# Patient Record
Sex: Female | Born: 1985 | Race: White | Hispanic: No | Marital: Married | Smoking: Current every day smoker
Health system: Southern US, Community
[De-identification: ages and names within clinical notes are randomized; demographics above are authoritative.]

## PROBLEM LIST (undated history)

## (undated) ENCOUNTER — Inpatient Hospital Stay (HOSPITAL_COMMUNITY): Payer: Self-pay

## (undated) DIAGNOSIS — Z8742 Personal history of other diseases of the female genital tract: Secondary | ICD-10-CM

## (undated) DIAGNOSIS — M419 Scoliosis, unspecified: Secondary | ICD-10-CM

## (undated) DIAGNOSIS — E119 Type 2 diabetes mellitus without complications: Secondary | ICD-10-CM

## (undated) DIAGNOSIS — F99 Mental disorder, not otherwise specified: Secondary | ICD-10-CM

## (undated) DIAGNOSIS — T7840XA Allergy, unspecified, initial encounter: Secondary | ICD-10-CM

## (undated) DIAGNOSIS — K219 Gastro-esophageal reflux disease without esophagitis: Secondary | ICD-10-CM

## (undated) HISTORY — PX: CHOLECYSTECTOMY: SHX55

## (undated) HISTORY — DX: Personal history of other diseases of the female genital tract: Z87.42

## (undated) HISTORY — DX: Allergy, unspecified, initial encounter: T78.40XA

## (undated) HISTORY — PX: WISDOM TOOTH EXTRACTION: SHX21

---
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
## 2009-03-17 ENCOUNTER — Emergency Department: Payer: Self-pay | Admitting: Emergency Medicine

## 2012-06-18 ENCOUNTER — Emergency Department (HOSPITAL_BASED_OUTPATIENT_CLINIC_OR_DEPARTMENT_OTHER)
Admission: EM | Admit: 2012-06-18 | Discharge: 2012-06-19 | Disposition: A | Discharge status: Home / Self Care | Payer: PRIVATE HEALTH INSURANCE | Attending: Emergency Medicine | Admitting: Emergency Medicine

## 2012-06-18 ENCOUNTER — Encounter (HOSPITAL_BASED_OUTPATIENT_CLINIC_OR_DEPARTMENT_OTHER): Payer: Self-pay | Admitting: *Deleted

## 2012-06-18 DIAGNOSIS — K2970 Gastritis, unspecified, without bleeding: Secondary | ICD-10-CM

## 2012-06-18 DIAGNOSIS — Z79899 Other long term (current) drug therapy: Secondary | ICD-10-CM | POA: Insufficient documentation

## 2012-06-18 DIAGNOSIS — M4120 Other idiopathic scoliosis, site unspecified: Secondary | ICD-10-CM | POA: Insufficient documentation

## 2012-06-18 DIAGNOSIS — R110 Nausea: Secondary | ICD-10-CM | POA: Insufficient documentation

## 2012-06-18 DIAGNOSIS — F17200 Nicotine dependence, unspecified, uncomplicated: Secondary | ICD-10-CM | POA: Insufficient documentation

## 2012-06-18 DIAGNOSIS — K219 Gastro-esophageal reflux disease without esophagitis: Secondary | ICD-10-CM | POA: Insufficient documentation

## 2012-06-18 DIAGNOSIS — Z3202 Encounter for pregnancy test, result negative: Secondary | ICD-10-CM | POA: Insufficient documentation

## 2012-06-18 HISTORY — DX: Scoliosis, unspecified: M41.9

## 2012-06-18 LAB — URINALYSIS, ROUTINE W REFLEX MICROSCOPIC
Bilirubin Urine: NEGATIVE
Glucose, UA: NEGATIVE mg/dL
Hgb urine dipstick: NEGATIVE
Ketones, ur: NEGATIVE mg/dL
Nitrite: NEGATIVE
Protein, ur: NEGATIVE mg/dL
Specific Gravity, Urine: 1.014 (ref 1.005–1.030)
Urobilinogen, UA: 0.2 mg/dL (ref 0.0–1.0)
pH: 5.5 (ref 5.0–8.0)

## 2012-06-18 LAB — PREGNANCY, URINE: Preg Test, Ur: NEGATIVE

## 2012-06-18 LAB — URINE MICROSCOPIC-ADD ON

## 2012-06-18 NOTE — ED Notes (Signed)
Pt eating and drinking food from [facility] when called to room

## 2012-06-18 NOTE — ED Notes (Signed)
Pt reports crampy abd pain since 5pm- took ibuprofen with some relief- nausea, denies vomiting- reports bad reflux x 3 days

## 2012-06-19 MED ORDER — RANITIDINE HCL 150 MG PO TABS: 150.0000 mg | ORAL_TABLET | Freq: Two times a day (BID) | ORAL | Status: DC

## 2012-06-19 MED ORDER — SUCRALFATE 1 GM/10ML PO SUSP: 1.0000 g | Freq: Four times a day (QID) | ORAL | Status: DC

## 2012-06-19 NOTE — ED Notes (Signed)
Pt. Reports she has had acid reflux for 3 days now and feeling bloated.  Pt. Reports her abd. Is feeling better now.  Pt. Reports the discomfort started hurting about 1800 on Fri. Night.  Pt. Reports a bloated type feeling mid abd.  Pt. Has had no vomiting or diarrhea.

## 2012-06-19 NOTE — ED Notes (Signed)
MD at bedside. 

## 2012-06-19 NOTE — ED Provider Notes (Signed)
History     CSN: [id number]  Arrival date & time 06/18/12  2243   First MD Initiated Contact with Patient 06/19/12 0038      Chief Complaint  Patient presents with  . Abdominal Pain    (Consider location/radiation/quality/duration/timing/severity/associated sxs/prior treatment) Patient is a 26 y.o. female presenting with abdominal pain. The history is provided by the patient.  Abdominal Pain The primary symptoms of the illness include abdominal pain and nausea. The primary symptoms of the illness do not include fever, shortness of breath, vomiting, diarrhea, dysuria or vaginal discharge. Primary symptoms comment: bloating The current episode started 1 to 2 hours ago. The onset of the illness was sudden. The problem has been rapidly improving.  The abdominal pain is located in the epigastric region. Pain radiation: upper back. Pain scale: was an 8/10 but now a 2/10. Relieved by: ibuprofen.  The illness is associated with alcohol use and NSAID use. The patient states that she believes she is currently not pregnant. The patient has not had a change in bowel habit. Symptoms associated with the illness do not include anorexia, constipation, urgency, frequency or back pain. Significant associated medical issues include GERD.    Past Medical History  Diagnosis Date  . Scoliosis     Past Surgical History  Procedure Date  . Wisdom tooth extraction     No family history on file.  History  Substance Use Topics  . Smoking status: Current Every Day Smoker    Types: Cigarettes  . Smokeless tobacco: Never Used  . Alcohol Use: 2.4 oz/week    2 Cans of beer, 2 Glasses of wine per week    OB History    Grav Para Term Preterm Abortions TAB SAB Ect Mult Living                  Review of Systems  Constitutional: Negative for fever.  Respiratory: Negative for cough and shortness of breath.   Cardiovascular: Negative for chest pain.  Gastrointestinal: Positive for nausea and abdominal  pain. Negative for vomiting, diarrhea, constipation and anorexia.  Genitourinary: Negative for dysuria, urgency, frequency and vaginal discharge.  Musculoskeletal: Negative for back pain.  All other systems reviewed and are negative.    Allergies  Latex  Home Medications   Current Outpatient Rx  Name  Route  Sig  Dispense  Refill  . CETIRIZINE HCL 10 MG PO TABS   Oral   Take 10 mg by mouth daily.         . IBUPROFEN 200 MG PO TABS   Oral   Take 400 mg by mouth every 6 (six) hours as needed.         Marland Kitchen RANITIDINE HCL 75 MG PO TABS   Oral   Take 75 mg by mouth 2 (two) times daily.         Marland Kitchen RANITIDINE HCL 150 MG PO TABS   Oral   Take 1 tablet (150 mg total) by mouth 2 (two) times daily.   28 tablet   0   . SUCRALFATE 1 GM/10ML PO SUSP   Oral   Take 10 mLs (1 g total) by mouth 4 (four) times daily.   420 mL   0     BP 121/73  Pulse 80  Temp 98.6 F (37 C) (Oral)  Resp 18  Ht 6' (1.829 m)  Wt 175 lb (79.379 kg)  BMI 23.73 kg/m2  SpO2 99%  LMP 06/11/2012  Physical Exam  Nursing note  and vitals reviewed. Constitutional: She is oriented to person, place, and time. She appears well-developed and well-nourished. No distress.  HENT:  Head: Normocephalic and atraumatic.  Mouth/Throat: Oropharynx is clear and moist.  Eyes: Conjunctivae normal and EOM are normal. Pupils are equal, round, and reactive to light.  Neck: Normal range of motion. Neck supple.  Cardiovascular: Normal rate, regular rhythm and intact distal pulses.   No murmur heard. Pulmonary/Chest: Effort normal and breath sounds normal. No respiratory distress. She has no wheezes. She has no rales.  Abdominal: Soft. Normal appearance. She exhibits no distension. There is tenderness in the epigastric area. There is no rebound and no guarding.  Musculoskeletal: Normal range of motion. She exhibits no edema and no tenderness.  Neurological: She is alert and oriented to person, place, and time.  Skin:  Skin is warm and dry. No rash noted. No erythema.  Psychiatric: She has a normal mood and affect. Her behavior is normal.    ED Course  Procedures (including critical care time)  Labs Reviewed  URINALYSIS, ROUTINE W REFLEX MICROSCOPIC - Abnormal; Notable for the following:    Leukocytes, UA SMALL (*)     All other components within normal limits  URINE MICROSCOPIC-ADD ON - Abnormal; Notable for the following:    Squamous Epithelial / LPF FEW (*)     Bacteria, UA FEW (*)     All other components within normal limits  PREGNANCY, URINE   No results found.   1. Gastritis       MDM   Patient with a history of abdominal pain today that started in her epigastric area and caused severe cramping pain with nausea and bloating but no vomiting or diarrhea. On exam now her pain is improved she refused a GI cocktail. She has no right upper quadrant or left upper quadrant tenderness concerning for cholecystitis or pancreatitis. She denies any urinary or vaginal symptoms at this time. Urine was contaminated with only a small amount of leukocytes but no white blood cells and few epithelial cells. Because patient is asymptomatic do not feel that she needs antibiotics at this time. Urine pregnancy test was negative. Patient is a smoker and heavy coffee drinker which is most likely the ureters causing her gastritis. She took one Zantac today however encouraged her to continue Zantac and Carafate as well. Counseled the patient and reasons to return to the emergency room if symptoms worsen.        Gwyneth Sprout, MD 06/19/12 [phone]

## 2012-08-01 ENCOUNTER — Telehealth: Payer: Self-pay

## 2012-08-01 ENCOUNTER — Ambulatory Visit (INDEPENDENT_AMBULATORY_CARE_PROVIDER_SITE_OTHER): Payer: PRIVATE HEALTH INSURANCE | Admitting: Internal Medicine

## 2012-08-01 VITALS — BP 134/78 | HR 83 | Temp 98.7°F | Resp 16 | Ht 70.0 in | Wt 202.0 lb

## 2012-08-01 DIAGNOSIS — R768 Other specified abnormal immunological findings in serum: Secondary | ICD-10-CM

## 2012-08-01 DIAGNOSIS — M419 Scoliosis, unspecified: Secondary | ICD-10-CM

## 2012-08-01 DIAGNOSIS — F17200 Nicotine dependence, unspecified, uncomplicated: Secondary | ICD-10-CM

## 2012-08-01 DIAGNOSIS — K921 Melena: Secondary | ICD-10-CM

## 2012-08-01 DIAGNOSIS — J111 Influenza due to unidentified influenza virus with other respiratory manifestations: Secondary | ICD-10-CM

## 2012-08-01 DIAGNOSIS — Z6828 Body mass index (BMI) 28.0-28.9, adult: Secondary | ICD-10-CM

## 2012-08-01 DIAGNOSIS — R5383 Other fatigue: Secondary | ICD-10-CM

## 2012-08-01 DIAGNOSIS — R635 Abnormal weight gain: Secondary | ICD-10-CM

## 2012-08-01 DIAGNOSIS — R5381 Other malaise: Secondary | ICD-10-CM

## 2012-08-01 DIAGNOSIS — R109 Unspecified abdominal pain: Secondary | ICD-10-CM

## 2012-08-01 LAB — COMPREHENSIVE METABOLIC PANEL
ALT: 17 U/L (ref 0–35)
AST: 16 U/L (ref 0–37)
Albumin: 4.8 g/dL (ref 3.5–5.2)
Alkaline Phosphatase: 65 U/L (ref 39–117)
BUN: 10 mg/dL (ref 6–23)
CO2: 27 mEq/L (ref 19–32)
Calcium: 9.4 mg/dL (ref 8.4–10.5)
Chloride: 101 mEq/L (ref 96–112)
Creat: 0.55 mg/dL (ref 0.50–1.10)
Glucose, Bld: 112 mg/dL — ABNORMAL HIGH (ref 70–99)
Potassium: 4 mEq/L (ref 3.5–5.3)
Sodium: 136 mEq/L (ref 135–145)
Total Bilirubin: 0.4 mg/dL (ref 0.3–1.2)
Total Protein: 7.5 g/dL (ref 6.0–8.3)

## 2012-08-01 LAB — POCT CBC
Granulocyte percent: 54.3 %G (ref 37–80)
HCT, POC: 45.3 % (ref 37.7–47.9)
Hemoglobin: 14.1 g/dL (ref 12.2–16.2)
Lymph, poc: 1.7 (ref 0.6–3.4)
MCH, POC: 29.1 pg (ref 27–31.2)
MCHC: 31.1 g/dL — AB (ref 31.8–35.4)
MCV: 93.5 fL (ref 80–97)
MID (cbc): 0.5 (ref 0–0.9)
MPV: 8 fL (ref 0–99.8)
POC Granulocyte: 2.6 (ref 2–6.9)
POC LYMPH PERCENT: 35.4 %L (ref 10–50)
POC MID %: 10.3 %M (ref 0–12)
Platelet Count, POC: 289 10*3/uL (ref 142–424)
RBC: 4.84 M/uL (ref 4.04–5.48)
RDW, POC: 13.8 %
WBC: 4.7 10*3/uL (ref 4.6–10.2)

## 2012-08-01 MED ORDER — AZITHROMYCIN 250 MG PO TABS: ORAL_TABLET | ORAL | Status: DC

## 2012-08-01 MED ORDER — OSELTAMIVIR PHOSPHATE 75 MG PO CAPS: 75.0000 mg | ORAL_CAPSULE | Freq: Two times a day (BID) | ORAL | Status: DC

## 2012-08-01 NOTE — Progress Notes (Addendum)
Subjective:    Patient ID: Carmen Gibbs, female    DOB: 02-08-1986, 27 y.o.   MRN: [id number]  HPI here w/ several problems For the past 48 hours she has had fever chills myalgias nonproductive cough and headache She has also had purulent sinus discharge with headache Her partner works for EMS and is sure she has an infection  Recent weight gain from 175-202 In less than a month without changing eating habits Reflux has been bad in recent months/cut caff but only a little better Has had epigastric abdominal pain which worsens after eating for one month Has had black bowel movements for one week/no diarrhea Has marked fatigue for the past several weeks is often cold No skin or hair changes   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 frequent from scoliosis/resolve with chiropractic manipulation No vision changes No chest pain or shortness of breath Recently he is easily winded but no wheezing No edema GU negative No bone or joint problems Psychiatric negative No menstrual problems     Objective:   Physical Exam BP 134/78  Pulse 83  Temp 98.7 F (37.1 C)  Resp 16  Ht 5\' 10"  (1.778 m)  Wt 202 lb (91.627 kg)  BMI 28.98 kg/m2  SpO2 99%  LMP 07/11/2012 No acute distress Pupils equal round reactive to light and accommodation/conjunctiva clear Nares with copious purulent mucus TMs clear Throat clear No nodes or thyromegaly Chest clear/heart regular Abdomen soft  nondistended with no organomegaly/is tender in the epigastrium/no extra masses Skin clear      Results for orders placed in visit on 08/01/12  POCT CBC      Component Value Range   WBC 4.7  4.6 - 10.2 K/uL   Lymph, poc 1.7  0.6 - 3.4   POC LYMPH PERCENT 35.4  10 - 50 %L   MID (cbc) 0.5  0 - 0.9   POC MID % 10.3  0 - 12 %M   POC Granulocyte 2.6  2 - 6.9   Granulocyte percent 54.3  37 - 80 %G   RBC 4.84  4.04 - 5.48 M/uL   Hemoglobin 14.1  12.2 - 16.2 g/dL   HCT, POC  11.9  14.7 - 47.9 %   MCV 93.5  80 - 97 fL   MCH, POC 29.1  27 - 31.2 pg   MCHC 31.1 (*) 31.8 - 35.4 g/dL   RDW, POC 82.9     Platelet Count, POC 289  142 - 424 K/uL   MPV 8.0  0 - 99.8 fL    Assessment & Plan:   1. Abdominal pain    2. Fatigue    3. Weight gain    4. Melena    5. Flu -possible sinusitis    6. Nicotine addiction    7. BMI 28.0-28.9,adult    8. Scoliosis     Metabolic profile/H. pylori IgG/Hemoccult/TSH Meds ordered this encounter  Medications  . azithromycin (ZITHROMAX) 250 MG tablet    Sig: As packaged    Dispense:  6 tablet    Refill:  0  . oseltamivir (TAMIFLU) 75 MG capsule    Sig: Take 1 capsule (75 mg total) by mouth 2 (two) times daily.    Dispense:  10 capsule    Refill:  0   call with plan after labs-? Start PPI May need endoscopy 50 minute office visit  Addendum 08/08/12 Positive H. Pylori Triple therapy Meds ordered this encounter  Medications  . esomeprazole (NEXIUM) 40 MG capsule    Sig: 1 tablet twice a day for 10 days and then continue it once a day with 2 refills    Dispense:  30 capsule    Refill:  2  . clarithromycin (BIAXIN) 500 MG tablet    Sig: Take 1 tablet (500 mg total) by mouth 2 (two) times daily.    Dispense:  20 tablet    Refill:  0  . amoxicillin (AMOXIL) 500 MG capsule    Sig: Take 2 capsules (1,000 mg total) by mouth 2 (two) times daily.    Dispense:  40 capsule    Refill:  0

## 2012-08-01 NOTE — Telephone Encounter (Signed)
Pt left before hemosure was given. Left one up front for pt to p/u

## 2012-08-02 LAB — TSH: TSH: 1.478 u[IU]/mL (ref 0.350–4.500)

## 2012-08-02 LAB — HELICOBACTER PYLORI  ANTIBODY, IGM: Helicobacter pylori, IgM: 22.4 U/mL — ABNORMAL HIGH (ref <9.0)

## 2012-08-08 ENCOUNTER — Encounter: Payer: Self-pay | Admitting: Internal Medicine

## 2012-08-08 DIAGNOSIS — R768 Other specified abnormal immunological findings in serum: Secondary | ICD-10-CM

## 2012-08-08 HISTORY — DX: Other specified abnormal immunological findings in serum: R76.8

## 2012-08-08 MED ORDER — ESOMEPRAZOLE MAGNESIUM 40 MG PO CPDR: DELAYED_RELEASE_CAPSULE | ORAL | Status: DC

## 2012-08-08 MED ORDER — AMOXICILLIN 500 MG PO CAPS: 1000.0000 mg | ORAL_CAPSULE | Freq: Two times a day (BID) | ORAL | Status: AC

## 2012-08-08 MED ORDER — CLARITHROMYCIN 500 MG PO TABS: 500.0000 mg | ORAL_TABLET | Freq: Two times a day (BID) | ORAL | Status: DC

## 2012-08-08 NOTE — Addendum Note (Signed)
Addended by: Tonye Pearson on: 08/08/2012 01:54 PM   Modules accepted: Orders

## 2012-08-09 ENCOUNTER — Telehealth: Payer: Self-pay

## 2012-08-09 NOTE — Telephone Encounter (Signed)
PT STATES DR Merla Riches SAID WHEN SHE FINISHED HER ANTIBIOTICS HE WOULD CALL IN SOME MEDICINE FOR HER BLEEDING ULCERS. PLEASE CALL [phone]     [facility] ON [address]

## 2012-08-10 NOTE — Telephone Encounter (Signed)
Thanks, called patient to advise.  

## 2012-08-10 NOTE — Telephone Encounter (Signed)
Has been done and letter sent to her /can go to pharm now

## 2013-03-27 ENCOUNTER — Ambulatory Visit: Payer: Managed Care, Other (non HMO) | Admitting: Family Medicine

## 2013-03-27 ENCOUNTER — Encounter: Payer: Self-pay | Admitting: Family Medicine

## 2013-03-27 VITALS — BP 132/84 | HR 82 | Temp 98.5°F | Resp 16 | Ht 70.0 in | Wt 204.0 lb

## 2013-03-27 DIAGNOSIS — J329 Chronic sinusitis, unspecified: Secondary | ICD-10-CM

## 2013-03-27 MED ORDER — PREDNISONE 20 MG PO TABS: ORAL_TABLET | ORAL | Status: DC

## 2013-03-27 MED ORDER — AZITHROMYCIN 250 MG PO TABS: ORAL_TABLET | ORAL | Status: DC

## 2013-03-27 NOTE — Patient Instructions (Signed)

## 2013-03-27 NOTE — Progress Notes (Signed)
Patient ID: Carmen Gibbs MRN: [id number], DOB: 06/15/1986, 27 y.o. Date of Encounter: 03/27/2013, 9:02 AM  Primary Physician: No primary provider on file.  Chief Complaint:  Chief Complaint  Patient presents with  . Nasal Congestion    x 3 days   . Shortness of Breath    HPI: 27 y.o. year old female presents with 3 day history of nasal congestion, post nasal drip, sore throat, sinus pressure, and cough. Afebrile. No chills. Nasal congestion thick and green/yellow. Sinus pressure is the worst symptom. Cough is productive secondary to post nasal drip and not associated with time of day. Ears feel full, leading to sensation of muffled hearing. Has tried OTC cold preps without success. No GI complaints.   No recent antibiotics, recent travels, or sick contacts   No leg trauma, sedentary periods, h/o cancer, or tobacco use.  Past Medical History  Diagnosis Date  . Scoliosis   . Allergy      Home Meds: Prior to Admission medications   Medication Sig Start Date End Date Taking? Authorizing Provider  azithromycin (ZITHROMAX) 250 MG tablet As packaged 03/27/13   Elvina Sidle, MD  clarithromycin (BIAXIN) 500 MG tablet Take 1 tablet (500 mg total) by mouth 2 (two) times daily. 08/08/12   Tonye Pearson, MD  esomeprazole (NEXIUM) 40 MG capsule 1 tablet twice a day for 10 days and then continue it once a day with 2 refills 08/08/12   Tonye Pearson, MD  oseltamivir (TAMIFLU) 75 MG capsule Take 1 capsule (75 mg total) by mouth 2 (two) times daily. 08/01/12   Tonye Pearson, MD  predniSONE (DELTASONE) 20 MG tablet 2 daily with food 03/27/13   Elvina Sidle, MD    Allergies:  Allergies  Allergen Reactions  . Latex Swelling and Rash    History   Social History  . Marital Status: Single    Spouse Name: N/A    Number of Children: N/A  . Years of Education: N/A   Occupational History  . Not on file.   Social History Main Topics  . Smoking status: Current Every Day  Smoker    Types: Cigarettes  . Smokeless tobacco: Never Used  . Alcohol Use: 2.4 oz/week    2 Cans of beer, 2 Glasses of wine per week  . Drug Use: No  . Sexual Activity: Yes    Birth Control/ Protection: None   Other Topics Concern  . Not on file   Social History Narrative  . No narrative on file     Review of Systems: Constitutional: negative for chills, fever, night sweats or weight changes Cardiovascular: negative for chest pain or palpitations Respiratory: negative for hemoptysis, wheezing, or shortness of breath Abdominal: negative for abdominal pain, nausea, vomiting or diarrhea Dermatological: negative for rash Neurologic: negative for headache   Physical Exam: Blood pressure 132/84, pulse 82, temperature 98.5 F (36.9 C), temperature source Oral, resp. rate 16, height 5\' 10"  (1.778 m), weight 204 lb (92.534 kg), last menstrual period 03/21/2013, SpO2 99.00%., Body mass index is 29.27 kg/(m^2). General: Well developed, well nourished, in no acute distress. Head: Normocephalic, atraumatic, eyes without discharge, sclera non-icteric, nares are congested. Bilateral auditory canals clear, TM's are without perforation, pearly grey with reflective cone of light bilaterally. Serous effusion bilaterally behind TM's. Maxillary sinus TTP. Oral cavity moist, dentition normal. Posterior pharynx with post nasal drip and mild erythema. No peritonsillar abscess or tonsillar exudate. Neck: Supple. No thyromegaly. Full ROM. No lymphadenopathy. Lungs: Clear  bilaterally to auscultation without wheezes, rales, or rhonchi. Breathing is unlabored.  Heart: RRR with S1 S2. No murmurs, rubs, or gallops appreciated. Msk:  Strength and tone normal for age. Extremities: No clubbing or cyanosis. No edema. Neuro: Alert and oriented X 3. Moves all extremities spontaneously. CNII-XII grossly in tact. Psych:  Responds to questions appropriately with a normal affect.    ASSESSMENT AND PLAN:  27 y.o.  year old female with sinusitis Sinusitis - Plan: azithromycin (ZITHROMAX) 250 MG tablet, predniSONE (DELTASONE) 20 MG tablet  -Tylenol/Motrin prn -Rest/fluids -RTC precautions -RTC 3-5 days if no improvement  Signed, Elvina Sidle, MD 03/27/2013 9:02 AM

## 2013-07-28 ENCOUNTER — Ambulatory Visit (INDEPENDENT_AMBULATORY_CARE_PROVIDER_SITE_OTHER): Payer: 59 | Admitting: Emergency Medicine

## 2013-07-28 VITALS — BP 110/62 | HR 62 | Temp 98.7°F | Resp 18 | Ht 71.0 in | Wt 203.0 lb

## 2013-07-28 DIAGNOSIS — J209 Acute bronchitis, unspecified: Secondary | ICD-10-CM

## 2013-07-28 DIAGNOSIS — J018 Other acute sinusitis: Secondary | ICD-10-CM

## 2013-07-28 MED ORDER — PSEUDOEPHEDRINE-GUAIFENESIN ER 60-600 MG PO TB12: 1.0000 | ORAL_TABLET | Freq: Two times a day (BID) | ORAL | Status: DC

## 2013-07-28 MED ORDER — GUAIFENESIN-DM 100-10 MG/5ML PO SYRP: 5.0000 mL | ORAL_SOLUTION | ORAL | Status: DC | PRN

## 2013-07-28 MED ORDER — AMOXICILLIN-POT CLAVULANATE 875-125 MG PO TABS: 1.0000 | ORAL_TABLET | Freq: Two times a day (BID) | ORAL | Status: DC

## 2013-07-28 NOTE — Patient Instructions (Signed)

## 2013-07-28 NOTE — Progress Notes (Signed)
[facility] [address], [place] [address] [phone]  Date:  07/28/2013   Name:  Carmen Gibbs   DOB:  [birth year]   MRN:  [id number]  PCP:  No primary provider on file.    Chief Complaint: Sore Throat, Generalized Body Aches, Cough, Fever and spot on left side of neck   History of Present Illness:  Carmen Gibbs is a 28 y.o. very pleasant female patient who presents with the following:  Ill with nasal congestion and purulent post nasal drainage.  Developed a sore throat and a cough that is nonproductive.  Had a fever last night no wheezing or shortness of breath.  No nausea or vomiting.  History of recurrent sinusitis.  Stopped smoking 10 days ago.  No improvement with over the counter medications or other home remedies. Denies other complaint or health concern today.   Patient Active Problem List   Diagnosis Date Noted  . Positive serology for Helicobacter pylori 08/08/2012  . Nicotine addiction 08/01/2012  . BMI 28.0-28.9,adult 08/01/2012  . Scoliosis 08/01/2012    Past Medical History  Diagnosis Date  . Scoliosis   . Allergy     Past Surgical History  Procedure Laterality Date  . Wisdom tooth extraction      History  Substance Use Topics  . Smoking status: Former Smoker    Types: Cigarettes  . Smokeless tobacco: Never Used  . Alcohol Use: 2.4 oz/week    2 Cans of beer, 2 Glasses of wine per week    Family History  Problem Relation Age of Onset  . Diabetes Mother   . Hypertension Father     Allergies  Allergen Reactions  . Latex Swelling and Rash    Medication list has been reviewed and updated.  Current Outpatient Prescriptions on File Prior to Visit  Medication Sig Dispense Refill  . azithromycin (ZITHROMAX) 250 MG tablet As packaged  6 tablet  0  . clarithromycin (BIAXIN) 500 MG tablet Take 1 tablet (500 mg total) by mouth 2 (two) times daily.  20 tablet  0  . esomeprazole (NEXIUM) 40 MG capsule 1 tablet twice a day for  10 days and then continue it once a day with 2 refills  30 capsule  2  . oseltamivir (TAMIFLU) 75 MG capsule Take 1 capsule (75 mg total) by mouth 2 (two) times daily.  10 capsule  0  . predniSONE (DELTASONE) 20 MG tablet 2 daily with food  10 tablet  1   No current facility-administered medications on file prior to visit.    Review of Systems:  As per HPI, otherwise negative.    Physical Examination: Filed Vitals:   07/28/13 0946  BP: 110/62  Pulse: 62  Temp: 98.7 F (37.1 C)  Resp: 18   Filed Vitals:   07/28/13 0946  Height: 5\' 11"  (1.803 m)  Weight: 203 lb (92.08 kg)   Body mass index is 28.33 kg/(m^2). Ideal Body Weight: Weight in (lb) to have BMI = 25: 178.9  GEN: WDWN, NAD, Non-toxic, A & O x 3 HEENT: Atraumatic, Normocephalic. Neck supple. No masses, No LAD. Ears and Nose: No external deformity. CV: RRR, No M/G/R. No JVD. No thrill. No extra heart sounds. PULM: CTA B, no wheezes, crackles, rhonchi. No retractions. No resp. distress. No accessory muscle use. ABD: S, NT, ND, +BS. No rebound. No HSM. EXTR: No c/c/e NEURO Normal gait.  PSYCH: Normally interactive. Conversant. Not depressed or anxious appearing.  Calm demeanor.  Assessment and Plan: Sinusitis Bronchitis augmentin mucinex d Robitussin dm  Signed,  Phillips OdorJeffery Parker Wherley, MD

## 2014-02-14 ENCOUNTER — Ambulatory Visit (INDEPENDENT_AMBULATORY_CARE_PROVIDER_SITE_OTHER): Payer: 59 | Admitting: Family Medicine

## 2014-02-14 VITALS — BP 104/62 | HR 77 | Temp 98.5°F | Resp 16 | Ht 69.5 in | Wt 201.0 lb

## 2014-02-14 DIAGNOSIS — J069 Acute upper respiratory infection, unspecified: Secondary | ICD-10-CM

## 2014-02-14 DIAGNOSIS — R42 Dizziness and giddiness: Secondary | ICD-10-CM

## 2014-02-14 DIAGNOSIS — H8110 Benign paroxysmal vertigo, unspecified ear: Secondary | ICD-10-CM

## 2014-02-14 DIAGNOSIS — H8112 Benign paroxysmal vertigo, left ear: Secondary | ICD-10-CM

## 2014-02-14 DIAGNOSIS — J0100 Acute maxillary sinusitis, unspecified: Secondary | ICD-10-CM

## 2014-02-14 DIAGNOSIS — R112 Nausea with vomiting, unspecified: Secondary | ICD-10-CM

## 2014-02-14 DIAGNOSIS — J029 Acute pharyngitis, unspecified: Secondary | ICD-10-CM

## 2014-02-14 LAB — GLUCOSE, POCT (MANUAL RESULT ENTRY): POC Glucose: 91 mg/dl (ref 70–99)

## 2014-02-14 LAB — POCT CBC
Granulocyte percent: 64.5 %G (ref 37–80)
HCT, POC: 42.4 % (ref 37.7–47.9)
Hemoglobin: 14 g/dL (ref 12.2–16.2)
Lymph, poc: 3 (ref 0.6–3.4)
MCH, POC: 29.7 pg (ref 27–31.2)
MCHC: 33 g/dL (ref 31.8–35.4)
MCV: 90.1 fL (ref 80–97)
MID (cbc): 0.5 (ref 0–0.9)
MPV: 7.1 fL (ref 0–99.8)
POC Granulocyte: 6.4 (ref 2–6.9)
POC LYMPH PERCENT: 30.1 %L (ref 10–50)
POC MID %: 5.4 %M (ref 0–12)
Platelet Count, POC: 297 10*3/uL (ref 142–424)
RBC: 4.71 M/uL (ref 4.04–5.48)
RDW, POC: 15 %
WBC: 10 10*3/uL (ref 4.6–10.2)

## 2014-02-14 LAB — POCT URINALYSIS DIPSTICK
Bilirubin, UA: NEGATIVE
Blood, UA: NEGATIVE
Glucose, UA: NEGATIVE
Ketones, UA: NEGATIVE
Nitrite, UA: NEGATIVE
Protein, UA: NEGATIVE
Spec Grav, UA: 1.015
Urobilinogen, UA: 0.2
pH, UA: 7.5

## 2014-02-14 LAB — POCT UA - MICROSCOPIC ONLY
Casts, Ur, LPF, POC: NEGATIVE
Crystals, Ur, HPF, POC: NEGATIVE
Mucus, UA: NEGATIVE
Yeast, UA: NEGATIVE

## 2014-02-14 LAB — POCT RAPID STREP A (OFFICE): Rapid Strep A Screen: NEGATIVE

## 2014-02-14 MED ORDER — MECLIZINE HCL 25 MG PO TABS: 25.0000 mg | ORAL_TABLET | Freq: Three times a day (TID) | ORAL | Status: DC | PRN

## 2014-02-14 MED ORDER — AMOXICILLIN 500 MG PO TABS: 1000.0000 mg | ORAL_TABLET | Freq: Two times a day (BID) | ORAL | Status: DC

## 2014-02-14 NOTE — Progress Notes (Addendum)
Subjective:    Patient ID: Carmen Gibbs, female    DOB: 1985/08/08, 29 y.o.   MRN: [id number] This chart was scribed for Nilda Simmer, MD by Evon Slack, ED Scribe. This Patient was seen in room 10 and the patients care was started at 7:25 PM  02/14/2014  Sinus Drainage and Emesis   Emesis  Associated symptoms include abdominal pain, chills, coughing, dizziness and a fever. Pertinent negatives include no diarrhea or headaches.  HPI Comments: Carmen Gibbs is a 28 y.o. female who presents to the [facility] complaining of Sinus pressure onset 4 days prior. [facility] or [facility] days [facility]. She states she has associated dizziness that started last night, near syncope, fever, congestion, cough, sob, emesis, and abdominal pain. She states she fills like her lungs are filled with fluid. She states her near syncope episode happened after vomiting. She states the dizziness worsens with movement and laying down. She states she has been taken sudafed with no relief. She states that she feels like all her symptoms are side effects of from the sudafed. She states she has taken sudafed before with out any complications but she has never taken the extended release sudafed as she is currently taking.  Denies sore throat today, diarrhea, headache, ear pain, or visual disturbance. Mild headache; no blurred vision; no hearing loss; no tinnitus.  No paresthesias or focal weakness. No neck stiffness; no confusion. She usually suffers with recurrent sinusitis; admits to sinus pressure now.  Review of Systems  Constitutional: Positive for fever, chills, diaphoresis and fatigue.  HENT: Positive for congestion, postnasal drip, rhinorrhea, sinus pressure and voice change. Negative for ear pain, sore throat and trouble swallowing.   Eyes: Negative for visual disturbance.  Respiratory: Positive for cough and shortness of breath. Negative for wheezing and  stridor.   Gastrointestinal: Positive for nausea, vomiting and abdominal pain. Negative for diarrhea.  Skin: Negative for rash.  Neurological: Positive for dizziness and syncope. Negative for tremors, seizures, speech difficulty, weakness, light-headedness, numbness and headaches.  Psychiatric/Behavioral: Negative for confusion.    Past Medical History  Diagnosis Date  . Scoliosis   . Allergy    Past Surgical History  Procedure Laterality Date  . Wisdom tooth extraction     Allergies  Allergen Reactions  . Latex Swelling and Rash   Current Outpatient Prescriptions  Medication Sig Dispense Refill  . pseudoephedrine (SUDAFED) 120 MG 12 hr tablet Take 120 mg by mouth daily.      Marland Kitchen amoxicillin (AMOXIL) 500 MG tablet Take 2 tablets (1,000 mg total) by mouth 2 (two) times daily.  40 tablet  0  . amoxicillin-clavulanate (AUGMENTIN) 875-125 MG per tablet Take 1 tablet by mouth 2 (two) times daily.  20 tablet  0  . azithromycin (ZITHROMAX) 250 MG tablet As packaged  6 tablet  0  . clarithromycin (BIAXIN) 500 MG tablet Take 1 tablet (500 mg total) by mouth 2 (two) times daily.  20 tablet  0  . esomeprazole (NEXIUM) 40 MG capsule 1 tablet twice a day for 10 days and then continue it once a day with 2 refills  30 capsule  2  . guaiFENesin-dextromethorphan (ROBITUSSIN DM) 100-10 MG/5ML syrup Take 5 mLs by mouth every 4 (four) hours as needed for cough.  118 mL  0  . meclizine (ANTIVERT) 25 MG tablet Take 1 tablet (25 mg total) by mouth 3 (three) times daily as needed for dizziness.  30 tablet  0  . oseltamivir (TAMIFLU) 75 MG capsule Take 1 capsule (75 mg total) by mouth 2 (two) times daily.  10 capsule  0  . predniSONE (DELTASONE) 20 MG tablet 2 daily with food  10 tablet  1  . pseudoephedrine-guaifenesin (MUCINEX D) 60-600 MG per tablet Take 1 tablet by mouth every 12 (twelve) hours.  18 tablet  0   No current facility-administered medications for this visit.   History   Social History  .  Marital Status: Single    Spouse Name: N/A    Number of Children: N/A  . Years of Education: N/A   Occupational History  . Not on file.   Social History Main Topics  . Smoking status: Current Some Day Smoker    Types: Cigarettes  . Smokeless tobacco: Never Used  . Alcohol Use: 2.4 oz/week    2 Cans of beer, 2 Glasses of wine per week  . Drug Use: No  . Sexual Activity: Yes    Birth Control/ Protection: None   Other Topics Concern  . Not on file   Social History Narrative  . No narrative on file       Objective:    BP 104/62  Pulse 77  Temp(Src) 98.5 F (36.9 C) (Oral)  Resp 16  Ht 5' 9.5" (1.765 m)  Wt 201 lb (91.173 kg)  BMI 29.27 kg/m2  SpO2 99%  LMP 01/22/2014  Physical Exam  Nursing note and vitals reviewed. Constitutional: She is oriented to person, place, and time. She appears well-developed and well-nourished. No distress.  HENT:  Head: Normocephalic and atraumatic.  Right Ear: Tympanic membrane, external ear and ear canal normal.  Left Ear: Tympanic membrane, external ear and ear canal normal.  Nose: Mucosal edema and rhinorrhea present. Right sinus exhibits maxillary sinus tenderness and frontal sinus tenderness. Left sinus exhibits maxillary sinus tenderness and frontal sinus tenderness.  Mouth/Throat: Uvula is midline and mucous membranes are normal. Posterior oropharyngeal erythema present. No oropharyngeal exudate, posterior oropharyngeal edema or tonsillar abscesses.  Oropharynx Mild erythema diffusely  Eyes: Conjunctivae and EOM are normal.  Neck: Neck supple. No tracheal deviation present.  Cardiovascular: Normal rate.   Pulmonary/Chest: Effort normal. No respiratory distress.  Abdominal: There is tenderness.  mild epigastric tenderness  Musculoskeletal: Normal range of motion.  Lymphadenopathy:    She has no cervical adenopathy.  Neurological: She is alert and oriented to person, place, and time. No cranial nerve deficit or sensory deficit.  She exhibits normal muscle tone. She displays a negative Romberg sign. Coordination normal.  Dix-Hallpike negative; +dizziness induced; no nystagmus.  Skin: Skin is warm and dry. No rash noted.  Psychiatric: She has a normal mood and affect. Her behavior is normal.   Results for orders placed in visit on 02/14/14  CULTURE, GROUP A STREP      Result Value Ref Range   Organism ID, Bacteria Normal Upper Respiratory Flora     Organism ID, Bacteria No Beta Hemolytic Streptococci Isolated    COMPREHENSIVE METABOLIC PANEL      Result Value Ref Range   Sodium 139  135 - 145 mEq/L   Potassium 4.6  3.5 - 5.3 mEq/L   Chloride 102  96 - 112 mEq/L   CO2 26  19 - 32 mEq/L   Glucose, Bld 91  70 - 99 mg/dL   BUN 10  6 - 23 mg/dL   Creat 1.61  0.96 - 0.45 mg/dL   Total Bilirubin 0.5  0.2 - 1.2 mg/dL   Alkaline Phosphatase 69  39 - 117 U/L   AST 12  0 - 37 U/L   ALT 10  0 - 35 U/L   Total Protein 7.9  6.0 - 8.3 g/dL   Albumin 5.0  3.5 - 5.2 g/dL   Calcium 16.1  8.4 - 09.6 mg/dL  POCT CBC      Result Value Ref Range   WBC 10.0  4.6 - 10.2 K/uL   Lymph, poc 3.0  0.6 - 3.4   POC LYMPH PERCENT 30.1  10 - 50 %L   MID (cbc) 0.5  0 - 0.9   POC MID % 5.4  0 - 12 %M   POC Granulocyte 6.4  2 - 6.9   Granulocyte percent 64.5  37 - 80 %G   RBC 4.71  4.04 - 5.48 M/uL   Hemoglobin 14.0  12.2 - 16.2 g/dL   HCT, POC 04.5  40.9 - 47.9 %   MCV 90.1  80 - 97 fL   MCH, POC 29.7  27 - 31.2 pg   MCHC 33.0  31.8 - 35.4 g/dL   RDW, POC 81.1     Platelet Count, POC 297  142 - 424 K/uL   MPV 7.1  0 - 99.8 fL  GLUCOSE, POCT (MANUAL RESULT ENTRY)      Result Value Ref Range   POC Glucose 91  70 - 99 mg/dl  POCT RAPID STREP A (OFFICE)      Result Value Ref Range   Rapid Strep A Screen Negative  Negative  POCT UA - MICROSCOPIC ONLY      Result Value Ref Range   WBC, Ur, HPF, POC 1-3     RBC, urine, microscopic 0-1     Bacteria, U Microscopic trace     Mucus, UA neg     Epithelial cells, urine per micros 1-3      Crystals, Ur, HPF, POC neg     Casts, Ur, LPF, POC neg     Yeast, UA neg    POCT URINALYSIS DIPSTICK      Result Value Ref Range   Color, UA yellow     Clarity, UA clear     Glucose, UA neg     Bilirubin, UA neg     Ketones, UA neg     Spec Grav, UA 1.015     Blood, UA neg     pH, UA 7.5     Protein, UA neg     Urobilinogen, UA 0.2     Nitrite, UA neg     Leukocytes, UA Trace         Assessment & Plan:   1. Dizziness and giddiness   2. Nausea and vomiting, vomiting of unspecified type   3. Acute upper respiratory infections of unspecified site   4. Acute pharyngitis, unspecified pharyngitis type   5. Benign paroxysmal positional vertigo, left   6. Acute maxillary sinusitis, recurrence not specified    1. Dizziness:  New.  Secondary to acute illness.  Normal neurological exam in office; normal labs. 2.  Nausea with vomiting:  New. Associated with dizziness. Benign abdominal exam; benign urine; normal labs. BRAT diet; rx for Meclizine provided. 3.  URI/pharyngitis:  New. Cause of vertigo with vomiting.  Treat for evolving sinusitis.  Stop Sudafed at this time.   4.  Acute sinusitis:  New. Rx for Amoxicillin provided.  Stop Sudafed; start Flonase and Claritin. 5.  Benign positional  vertigo:  New.  Secondary to acute sinusitis.  Treat with Amoxicillin, Flonase, Claritin, and Meclizine.  RTC for acute worsening.  Meds ordered this encounter  Medications  . pseudoephedrine (SUDAFED) 120 MG 12 hr tablet    Sig: Take 120 mg by mouth daily.  Marland Kitchen. amoxicillin (AMOXIL) 500 MG tablet    Sig: Take 2 tablets (1,000 mg total) by mouth 2 (two) times daily.    Dispense:  40 tablet    Refill:  0  . meclizine (ANTIVERT) 25 MG tablet    Sig: Take 1 tablet (25 mg total) by mouth 3 (three) times daily as needed for dizziness.    Dispense:  30 tablet    Refill:  0    No Follow-up on file.  I personally performed the services described in this documentation, which was scribed in my  presence.  The recorded information has been reviewed and is accurate.  Nilda SimmerKristi Smith, M.D.  [facility]  [place] [address] [place], [place]  [id number] [phone] phone [phone] fax

## 2014-02-14 NOTE — Patient Instructions (Signed)
1.  Start taking Zyrtec 10mg   One tablet daily. 2. Stop Sudafed. 3.  Call if no improvement by Friday.  Benign Positional Vertigo Vertigo means you feel like you or your surroundings are moving when they are not. Benign positional vertigo is the most common form of vertigo. Benign means that the cause of your condition is not serious. Benign positional vertigo is more common in older adults. CAUSES  Benign positional vertigo is the result of an upset in the labyrinth system. This is an area in the middle ear that helps control your balance. This may be caused by a viral infection, head injury, or repetitive motion. However, often no specific cause is found. SYMPTOMS  Symptoms of benign positional vertigo occur when you move your head or eyes in different directions. Some of the symptoms may include:  Loss of balance and falls.  Vomiting.  Blurred vision.  Dizziness.  Nausea.  Involuntary eye movements (nystagmus). DIAGNOSIS  Benign positional vertigo is usually diagnosed by physical exam. If the specific cause of your benign positional vertigo is unknown, your caregiver may perform imaging tests, such as magnetic resonance imaging (MRI) or computed tomography (CT). TREATMENT  Your caregiver may recommend movements or procedures to correct the benign positional vertigo. Medicines such as meclizine, benzodiazepines, and medicines for nausea may be used to treat your symptoms. In rare cases, if your symptoms are caused by certain conditions that affect the inner ear, you may need surgery. HOME CARE INSTRUCTIONS   Follow your caregiver's instructions.  Move slowly. Do not make sudden body or head movements.  Avoid driving.  Avoid operating heavy machinery.  Avoid performing any tasks that would be dangerous to you or others during a vertigo episode.  Drink enough fluids to keep your urine clear or pale yellow. SEEK IMMEDIATE MEDICAL CARE IF:   You develop problems with walking,  weakness, numbness, or using your arms, hands, or legs.  You have difficulty speaking.  You develop severe headaches.  Your nausea or vomiting continues or gets worse.  You develop visual changes.  Your family or friends notice any behavioral changes.  Your condition gets worse.  You have a fever.  You develop a stiff neck or sensitivity to light. MAKE SURE YOU:   Understand these instructions.  Will watch your condition.  Will get help right away if you are not doing well or get worse. Document Released: 04/07/2006 Document Revised: 09/22/2011 Document Reviewed: 03/20/2011 [facility] Patient Information 2015 [employer] [place]. This information is not intended to replace advice given to you by your health care provider. Make sure you discuss any questions you have with your health care provider.

## 2014-02-15 LAB — COMPREHENSIVE METABOLIC PANEL
ALT: 10 U/L (ref 0–35)
AST: 12 U/L (ref 0–37)
Albumin: 5 g/dL (ref 3.5–5.2)
Alkaline Phosphatase: 69 U/L (ref 39–117)
BUN: 10 mg/dL (ref 6–23)
CO2: 26 mEq/L (ref 19–32)
Calcium: 10.2 mg/dL (ref 8.4–10.5)
Chloride: 102 mEq/L (ref 96–112)
Creat: 0.61 mg/dL (ref 0.50–1.10)
Glucose, Bld: 91 mg/dL (ref 70–99)
Potassium: 4.6 mEq/L (ref 3.5–5.3)
Sodium: 139 mEq/L (ref 135–145)
Total Bilirubin: 0.5 mg/dL (ref 0.2–1.2)
Total Protein: 7.9 g/dL (ref 6.0–8.3)

## 2014-02-17 LAB — CULTURE, GROUP A STREP: Organism ID, Bacteria: NORMAL

## 2014-12-31 DIAGNOSIS — Z9049 Acquired absence of other specified parts of digestive tract: Secondary | ICD-10-CM | POA: Insufficient documentation

## 2015-08-13 DIAGNOSIS — Z0000 Encounter for general adult medical examination without abnormal findings: Secondary | ICD-10-CM | POA: Insufficient documentation

## 2016-01-03 ENCOUNTER — Encounter (HOSPITAL_COMMUNITY): Payer: Self-pay

## 2016-01-03 ENCOUNTER — Encounter (HOSPITAL_COMMUNITY): Payer: Self-pay | Admitting: *Deleted

## 2016-01-03 ENCOUNTER — Inpatient Hospital Stay (HOSPITAL_COMMUNITY)
Admission: AD | Admit: 2016-01-03 | Discharge: 2016-01-07 | DRG: 885 | Disposition: A | Discharge status: Home / Self Care | Payer: 59 | Source: Intra-hospital | Attending: Psychiatry | Admitting: Psychiatry

## 2016-01-03 ENCOUNTER — Emergency Department (HOSPITAL_COMMUNITY)
Admission: EM | Admit: 2016-01-03 | Discharge: 2016-01-03 | Disposition: A | Discharge status: Other Acute Inpatient Hospital | Payer: 59 | Attending: Emergency Medicine | Admitting: Emergency Medicine

## 2016-01-03 DIAGNOSIS — F4325 Adjustment disorder with mixed disturbance of emotions and conduct: Secondary | ICD-10-CM | POA: Diagnosis not present

## 2016-01-03 DIAGNOSIS — F329 Major depressive disorder, single episode, unspecified: Secondary | ICD-10-CM | POA: Diagnosis not present

## 2016-01-03 DIAGNOSIS — R45851 Suicidal ideations: Secondary | ICD-10-CM | POA: Diagnosis present

## 2016-01-03 DIAGNOSIS — Z79899 Other long term (current) drug therapy: Secondary | ICD-10-CM | POA: Insufficient documentation

## 2016-01-03 DIAGNOSIS — F17210 Nicotine dependence, cigarettes, uncomplicated: Secondary | ICD-10-CM | POA: Insufficient documentation

## 2016-01-03 DIAGNOSIS — Z8052 Family history of malignant neoplasm of bladder: Secondary | ICD-10-CM | POA: Diagnosis not present

## 2016-01-03 DIAGNOSIS — F32A Depression, unspecified: Secondary | ICD-10-CM

## 2016-01-03 DIAGNOSIS — F314 Bipolar disorder, current episode depressed, severe, without psychotic features: Secondary | ICD-10-CM | POA: Diagnosis present

## 2016-01-03 DIAGNOSIS — F319 Bipolar disorder, unspecified: Secondary | ICD-10-CM | POA: Diagnosis present

## 2016-01-03 DIAGNOSIS — F17200 Nicotine dependence, unspecified, uncomplicated: Secondary | ICD-10-CM | POA: Diagnosis present

## 2016-01-03 DIAGNOSIS — G4700 Insomnia, unspecified: Secondary | ICD-10-CM | POA: Diagnosis present

## 2016-01-03 LAB — RAPID URINE DRUG SCREEN, HOSP PERFORMED
Amphetamines: NOT DETECTED
Barbiturates: NOT DETECTED
Benzodiazepines: NOT DETECTED
Cocaine: NOT DETECTED
Opiates: NOT DETECTED
Tetrahydrocannabinol: NOT DETECTED

## 2016-01-03 LAB — COMPREHENSIVE METABOLIC PANEL
ALT: 26 U/L (ref 14–54)
AST: 21 U/L (ref 15–41)
Albumin: 5 g/dL (ref 3.5–5.0)
Alkaline Phosphatase: 64 U/L (ref 38–126)
Anion gap: 8 (ref 5–15)
BUN: 11 mg/dL (ref 6–20)
CO2: 24 mmol/L (ref 22–32)
Calcium: 9.7 mg/dL (ref 8.9–10.3)
Chloride: 107 mmol/L (ref 101–111)
Creatinine, Ser: 0.68 mg/dL (ref 0.44–1.00)
GFR calc Af Amer: >60 mL/min (ref >60)
GFR calc non Af Amer: >60 mL/min (ref >60)
Glucose, Bld: 105 mg/dL — ABNORMAL HIGH (ref 65–99)
Potassium: 3.8 mmol/L (ref 3.5–5.1)
Sodium: 139 mmol/L (ref 135–145)
Total Bilirubin: 0.9 mg/dL (ref 0.3–1.2)
Total Protein: 8.1 g/dL (ref 6.5–8.1)

## 2016-01-03 LAB — SALICYLATE LEVEL: Salicylate Lvl: <4 mg/dL (ref 2.8–30.0)

## 2016-01-03 LAB — CBC
HCT: 41.1 % (ref 36.0–46.0)
Hemoglobin: 14.2 g/dL (ref 12.0–15.0)
MCH: 30.8 pg (ref 26.0–34.0)
MCHC: 34.5 g/dL (ref 30.0–36.0)
MCV: 89.2 fL (ref 78.0–100.0)
Platelets: 278 10*3/uL (ref 150–400)
RBC: 4.61 MIL/uL (ref 3.87–5.11)
RDW: 13.2 % (ref 11.5–15.5)
WBC: 12.4 10*3/uL — ABNORMAL HIGH (ref 4.0–10.5)

## 2016-01-03 LAB — PREGNANCY, URINE: Preg Test, Ur: NEGATIVE

## 2016-01-03 LAB — ACETAMINOPHEN LEVEL: Acetaminophen (Tylenol), Serum: <10 ug/mL — ABNORMAL LOW (ref 10–30)

## 2016-01-03 LAB — ETHANOL: Alcohol, Ethyl (B): <5 mg/dL (ref <5)

## 2016-01-03 MED ORDER — CARBAMAZEPINE 200 MG PO TABS
200.0000 mg | ORAL_TABLET | Freq: Two times a day (BID) | ORAL | Status: DC
  Filled 2016-01-03 (×2): qty 1

## 2016-01-03 MED ORDER — CLONIDINE HCL 0.1 MG PO TABS
0.1000 mg | ORAL_TABLET | Freq: Once | ORAL | Status: AC
  Administered 2016-01-03: 0.1 mg via ORAL
  Filled 2016-01-03: qty 1

## 2016-01-03 MED ORDER — ACETAMINOPHEN 325 MG PO TABS
650.0000 mg | ORAL_TABLET | Freq: Four times a day (QID) | ORAL | Status: DC | PRN
  Administered 2016-01-04 – 2016-01-06 (×2): 650 mg via ORAL
  Filled 2016-01-03 (×2): qty 2

## 2016-01-03 MED ORDER — TRAZODONE HCL 50 MG PO TABS: 50.0000 mg | ORAL_TABLET | Freq: Every day | ORAL | Status: DC

## 2016-01-03 MED ORDER — HYDROXYZINE HCL 25 MG PO TABS: 25.0000 mg | ORAL_TABLET | Freq: Four times a day (QID) | ORAL | Status: DC | PRN

## 2016-01-03 MED ORDER — TRAZODONE HCL 50 MG PO TABS
50.0000 mg | ORAL_TABLET | Freq: Every day | ORAL | Status: DC
  Administered 2016-01-03 – 2016-01-06 (×4): 50 mg via ORAL
  Filled 2016-01-03 (×6): qty 1

## 2016-01-03 MED ORDER — CARBAMAZEPINE 200 MG PO TABS
200.0000 mg | ORAL_TABLET | Freq: Two times a day (BID) | ORAL | Status: DC
  Administered 2016-01-04: 200 mg via ORAL
  Filled 2016-01-03 (×4): qty 1

## 2016-01-03 MED ORDER — LORAZEPAM 1 MG PO TABS
1.0000 mg | ORAL_TABLET | Freq: Three times a day (TID) | ORAL | Status: DC | PRN
  Administered 2016-01-03: 1 mg via ORAL
  Filled 2016-01-03: qty 1

## 2016-01-03 MED ORDER — LORAZEPAM 1 MG PO TABS
1.0000 mg | ORAL_TABLET | Freq: Once | ORAL | Status: AC
  Administered 2016-01-03: 1 mg via ORAL
  Filled 2016-01-03: qty 1

## 2016-01-03 MED ORDER — ONDANSETRON HCL 4 MG PO TABS
4.0000 mg | ORAL_TABLET | Freq: Three times a day (TID) | ORAL | Status: DC | PRN
  Administered 2016-01-03: 4 mg via ORAL
  Filled 2016-01-03: qty 1

## 2016-01-03 MED ORDER — NICOTINE 21 MG/24HR TD PT24
21.0000 mg | MEDICATED_PATCH | Freq: Every day | TRANSDERMAL | Status: DC
  Administered 2016-01-03: 21 mg via TRANSDERMAL
  Filled 2016-01-03: qty 1

## 2016-01-03 MED ORDER — ONDANSETRON HCL 4 MG PO TABS: 4.0000 mg | ORAL_TABLET | Freq: Three times a day (TID) | ORAL | Status: DC | PRN

## 2016-01-03 MED ORDER — ZOLPIDEM TARTRATE 5 MG PO TABS: 5.0000 mg | ORAL_TABLET | Freq: Every evening | ORAL | Status: DC | PRN

## 2016-01-03 MED ORDER — ALUM & MAG HYDROXIDE-SIMETH 200-200-20 MG/5ML PO SUSP: 30.0000 mL | ORAL | Status: DC | PRN

## 2016-01-03 MED ORDER — MAGNESIUM HYDROXIDE 400 MG/5ML PO SUSP: 30.0000 mL | Freq: Every day | ORAL | Status: DC | PRN

## 2016-01-03 MED ORDER — NICOTINE 21 MG/24HR TD PT24
21.0000 mg | MEDICATED_PATCH | Freq: Every day | TRANSDERMAL | Status: DC
  Administered 2016-01-04 – 2016-01-06 (×3): 21 mg via TRANSDERMAL
  Filled 2016-01-03 (×5): qty 1

## 2016-01-03 MED ORDER — ACETAMINOPHEN 325 MG PO TABS
650.0000 mg | ORAL_TABLET | ORAL | Status: DC | PRN
  Administered 2016-01-03: 650 mg via ORAL
  Filled 2016-01-03: qty 2

## 2016-01-03 NOTE — BH Assessment (Signed)
[name]  Per Thedore MinsMojeed Akintayo, MD, this pt requires psychiatric hospitalization at this time. Berneice Heinrichina Tate, RN, [facility] has assigned pt to Rm 402-1 Pt has signed Voluntary Admission and Consent for Treatment, as well as Consent to Release Information to her spouse, and signed forms have been faxed to [phone]. Pt's nurse, Dawnaly, has been notified. She agrees to send original documents along with pt via [facility], and to call report to [phone].  Doylene Canninghomas Arbutus Nelligan, MA Triage Specialist [phone]

## 2016-01-03 NOTE — ED Notes (Signed)
Pt partner states that the pt became very angry tonight and "smashed everything in the house". She has been very depressed, tonight stating that she does not want to live anymore, denies a specific plan. Pt started Zoloft about 3 months ago.

## 2016-01-03 NOTE — ED Notes (Signed)
Pt's purse given to Pt's wife to lock it up in their car.  [facility] and verbalized understanding.

## 2016-01-03 NOTE — ED Notes (Signed)
Patient's significant other in the bed with the patient. ED AD notified. Sitter remains at the bedside.

## 2016-01-03 NOTE — Progress Notes (Signed)
Carmen Gibbs is a 30 year old female being admitted voluntarily to [facility] from [facility].  She came to the ED reporting SI with a plan but would not disclose plan. She denies HI or A/V hallucinations. She reported to Clinical research associatewriter that she is going through a separation with her wife and "this is the first time she lied to me."  She became upset and destroyed property at their home. Pt states her sleeping and appetite has been up and down. Pt reports being diagnosed with Bipolar recently and prescribed Zoloft with a recent increase. Pt denies SA.  Admission paperwork completed and signed.  Belongings searched and no belongings needed to be locked in locker.  Skin assessment completed and noted multiple tattoos, 3 small well healed incision sites from gallbladder surgery, belly button ring and both nipples pierced.  Q 15 minute checks initiated for safety.  We will monitor the progress towards her goals.

## 2016-01-03 NOTE — ED Notes (Signed)
Patient transferred to [facility].  Left the unit ambulatory with [employer].  All belongings given to the driver.  

## 2016-01-03 NOTE — ED Notes (Signed)
Patient's significant other visiting. Patient came to the doorway and was angry. Patient upset about breakfast. Patient was expecting significant other to bring her breakfast from the outside. Explained the rules again to the patient. Patient stated she could only eat certain foods due to her gallbladder issues. Patient requested peanut butter and jelly sandwiches x 2. Order placed by tech. Patient also requested Nicotene patch and Ativan.

## 2016-01-03 NOTE — ED Provider Notes (Signed)
  Physical Exam  BP 117/59 mmHg  Pulse 84  Temp(Src) 98.9 F (37.2 C) (Oral)  Resp 16  SpO2 100%  LMP 12/27/2015  Physical Exam  ED Course  Procedures  [facility] Jama Flavorsobos.      Benjiman CoreNathan Lavinia Mcneely, MD 01/03/16 1650

## 2016-01-03 NOTE — ED Provider Notes (Signed)
CSN: [id number]     Arrival date & time 01/03/16  0218 History   First MD Initiated Contact with Patient 01/03/16 [phone]     Chief Complaint  Patient presents with  . Suicidal     (Consider location/radiation/quality/duration/timing/severity/associated sxs/prior Treatment) HPI Comments: 30 year old female with presumed bipolar disorder presents to the emergency department for psychiatric evaluation. Patient was started on Zoloft 3 months ago by her psychiatrist. She no longer sees this provider. She has history of "highs and lows", but has been experiencing worsening depression coupled with anger since Father's Day. Partner believes that this is largely due to the recent passing of her father from bladder cancer. This evening, patient became very depressed. She "smashed everything in the house". Patient making statements that she does not want to live anymore. She does continue to endorse suicidal ideations without plan. No history of behavioral health hospitalizations.  The history is provided by the patient. No language interpreter was used.    Past Medical History  Diagnosis Date  . Scoliosis   . Allergy    Past Surgical History  Procedure Laterality Date  . Wisdom tooth extraction     Family History  Problem Relation Age of Onset  . Diabetes Mother   . Hypertension Father    Social History  Substance Use Topics  . Smoking status: Current Some Day Smoker    Types: Cigarettes  . Smokeless tobacco: Never Used  . Alcohol Use: 2.4 oz/week    2 Glasses of wine, 2 Cans of beer per week   OB History    No data available      Review of Systems  Psychiatric/Behavioral: Positive for suicidal ideas, behavioral problems, dysphoric mood and agitation.  All other systems reviewed and are negative.   Allergies  Ciprofloxacin and Latex  Home Medications   Prior to Admission medications   Medication Sig Start Date End Date Taking? Authorizing Provider  acetaminophen (TYLENOL)  500 MG tablet Take 1,000 mg by mouth every 6 (six) hours as needed for mild pain.   Yes Historical Provider, MD  cetirizine (ZYRTEC) 10 MG tablet Take 10 mg by mouth daily.   Yes Historical Provider, MD  medroxyPROGESTERone (DEPO-PROVERA) 150 MG/ML injection Inject 150 mg into the muscle every 3 (three) months.   Yes Historical Provider, MD  sertraline (ZOLOFT) 100 MG tablet Take 100 mg by mouth daily.   Yes Historical Provider, MD  amoxicillin (AMOXIL) 500 MG tablet Take 2 tablets (1,000 mg total) by mouth 2 (two) times daily. Patient not taking: Reported on 01/03/2016 02/14/14   Ethelda ChickKristi M Smith, MD  meclizine (ANTIVERT) 25 MG tablet Take 1 tablet (25 mg total) by mouth 3 (three) times daily as needed for dizziness. Patient not taking: Reported on 01/03/2016 02/14/14   Ethelda ChickKristi M Smith, MD   BP 134/91 mmHg  Pulse 72  Temp(Src) 98.2 F (36.8 C) (Oral)  Resp 15  SpO2 98%  LMP 12/27/2015   Physical Exam  Constitutional: She is oriented to person, place, and time. She appears well-developed and well-nourished. No distress.  HENT:  Head: Normocephalic and atraumatic.  Eyes: Conjunctivae and EOM are normal. No scleral icterus.  Neck: Normal range of motion.  Pulmonary/Chest: Effort normal. No respiratory distress.  Musculoskeletal: Normal range of motion.  Neurological: She is alert and oriented to person, place, and time.  Skin: Skin is warm and dry. No rash noted. She is not diaphoretic. No erythema. No pallor.  Psychiatric: Her speech is normal. She is agitated  and withdrawn. Cognition and memory are normal. She exhibits a depressed mood. She expresses homicidal and suicidal ideation. She expresses no suicidal plans and no homicidal plans.  Wants to "hurt everyone"; no plan. Also endorsing SI.  Nursing note and vitals reviewed.   ED Course  Procedures (including critical care time) Labs Review Labs Reviewed  COMPREHENSIVE METABOLIC PANEL - Abnormal; Notable for the following:    Glucose,  Bld 105 (*)    All other components within normal limits  ACETAMINOPHEN LEVEL - Abnormal; Notable for the following:    Acetaminophen (Tylenol), Serum <10 (*)    All other components within normal limits  CBC - Abnormal; Notable for the following:    WBC 12.4 (*)    All other components within normal limits  ETHANOL  SALICYLATE LEVEL  URINE RAPID DRUG SCREEN, HOSP PERFORMED  PREGNANCY, URINE    Imaging Review No results found.   I have personally reviewed and evaluated these images and lab results as part of my medical decision-making.   EKG Interpretation None      MDM   Final diagnoses:  Depression with suicidal ideation  Adjustment disorder with mixed disturbance of emotions and conduct    30 year old female with a history of apparent bipolar disorder, started on Zoloft 3 months ago, presents to the emergency department for worsening anger and aggression as well as depression. Patient endorsing suicidal ideations without plan. Symptoms seem to center around Father's Day as patient recently lost her father to bladder cancer.   The patient has been medically cleared. She is pending psychiatric evaluation. She presented to the ED voluntarily. She should remain voluntary unless attempting to leave the department at which time I believe IVC would be indicated. Disposition pending TTS recommendations; to be determined by oncoming ED provider.   Filed Vitals:   01/03/16 0226  BP: 134/91  Pulse: 72  Temp: 98.2 F (36.8 C)  TempSrc: Oral  Resp: 15  SpO2: 98%     Antony MaduraKelly Angila Wombles, PA-C 01/03/16 [id number]  Derwood KaplanAnkit Nanavati, MD 01/08/16 [phone]

## 2016-01-03 NOTE — Tx Team (Addendum)
Initial Interdisciplinary Treatment Plan   PATIENT STRESSORS: Marital or family conflict Medication change or noncompliance   PATIENT STRENGTHS: Average or above average intelligence Communication skills Financial means   PROBLEM LIST: Problem List/Patient Goals Date to be addressed Date deferred Reason deferred Estimated date of resolution  Depression 01/03/16     Mood swings 01/03/16     Anxiety 01/03/16     "Figure out what is going on with my brain" 01/03/16     "Figure out if I am bipolar or not" 01/03/16                              DISCHARGE CRITERIA:  Improved stabilization in mood, thinking, and/or behavior Verbal commitment to aftercare and medication compliance  PRELIMINARY DISCHARGE PLAN: Outpatient therapy Medication management  PATIENT/FAMIILY INVOLVEMENT: This treatment plan has been presented to and reviewed with the patient, Carmen Gibbs.  The patient and family have been given the opportunity to ask questions and make suggestions.  Norm ParcelHeather V Reddick 01/03/2016, 7:57 PM

## 2016-01-03 NOTE — Progress Notes (Signed)
Patient attended Karaoke group tonight.  
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
## 2016-01-03 NOTE — Progress Notes (Signed)
Pt confirm pcp is Carmen Gibbs EPIC updated Pt with female visitor at bedside Pleasant interaction with pt

## 2016-01-04 MED ORDER — DIPHENHYDRAMINE HCL 25 MG PO CAPS: 25.0000 mg | ORAL_CAPSULE | Freq: Four times a day (QID) | ORAL | Status: DC | PRN

## 2016-01-04 MED ORDER — SERTRALINE HCL 50 MG PO TABS
150.0000 mg | ORAL_TABLET | Freq: Every day | ORAL | Status: DC
  Administered 2016-01-04 – 2016-01-07 (×4): 150 mg via ORAL
  Filled 2016-01-04 (×6): qty 1

## 2016-01-04 MED ORDER — QUETIAPINE FUMARATE 25 MG PO TABS
25.0000 mg | ORAL_TABLET | Freq: Two times a day (BID) | ORAL | Status: DC
  Administered 2016-01-04 – 2016-01-07 (×6): 25 mg via ORAL
  Filled 2016-01-04 (×9): qty 1

## 2016-01-04 NOTE — Progress Notes (Signed)
Patient ID: Carmen Gibbs, female   DOB: 1985/09/27, 30 y.o.   MRN: [id number] Received report from admission nurse Herbert SetaHeather, RN. D: client in room, asking for something to eat. Affect sad, but pleasant. A: Writer introduced self to client, gave something to eat, encouraged karaoke.Medications reviewed, administered Trazodone 50 mg for sleep. Staff will monitor q3015min for safety. R: client is safe on the unit, attended karaoke.

## 2016-01-04 NOTE — Plan of Care (Signed)
Problem: Coping: Goal: Ability to identify and develop effective coping behavior will improve Outcome: Progressing Emotional support and reassurance provided  Problem: Coping: Goal: Ability to cope will improve Outcome: Progressing Calm and cooperative; denies SI/HI

## 2016-01-04 NOTE — Progress Notes (Signed)
Patient ID: Carmen Gibbs, female   DOB: Nov 25, 1985, 30 y.o.   MRN: [id number] D: Client has visitors this evening, reports visit was nice. "feel better, having a diagnosis and a plan" "group was helpful, they talked about relationships and how they can be toxic" "I know what that's like cause you thinking more about what the other person wants" A: Writer provided emotional support, encouraged client to report any concerns. Medications reviewed, administered as ordered. Staff will monitor q7515min for safety. R: client is safe on the unit, attended group.
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
## 2016-01-04 NOTE — Tx Team (Signed)
Interdisciplinary Treatment Plan Update (Adult) Date: 01/04/2016    Time Reviewed: 9:30 AM  Progress in Treatment: Attending groups: Continuing to assess, patient new to milieu Participating in groups: Continuing to assess, patient new to milieu Taking medication as prescribed: Yes Tolerating medication: Yes Family/Significant other contact made: No, CSW assessing for appropriate contacts Patient understands diagnosis: Yes Discussing patient identified problems/goals with staff: Yes Medical problems stabilized or resolved: Yes Denies suicidal/homicidal ideation: Yes Issues/concerns per patient self-inventory: Yes Other:  New problem(s) identified: N/A  Discharge Plan or Barriers: CSW continuing to assess, patient new to milieu.  Reason for Continuation of Hospitalization:  Depression Anxiety Medication Stabilization   Comments: N/A  Estimated length of stay: 3-5 days    Patient is a 30 year old female who presented to the hospital with SI and recent diagnosis of Bipolar Disorder. Pt reports primary trigger(s) for admission was separation with wife. Patient will benefit from crisis stabilization, medication evaluation, group therapy and psycho education in addition to case management for discharge planning. At discharge, it is recommended that Pt remain compliant with established discharge plan and continued treatment.   Review of initial/current patient goals per problem list:  1. Goal(s): Patient will participate in aftercare plan   Met: No   Target date: 3-5 days post admission date   As evidenced by: Patient will participate within aftercare plan AEB aftercare provider and housing plan at discharge being identified.  6/23: Goal not met: CSW assessing for appropriate referrals for pt and will have follow up secured prior to d/c.    2. Goal (s): Patient will exhibit decreased depressive symptoms and suicidal ideations.   Met: No   Target date: 3-5 days post  admission date   As evidenced by: Patient will utilize self rating of depression at 3 or below and demonstrate decreased signs of depression or be deemed stable for discharge by MD.  6/23: Goal not met: Pt presents with flat affect and depressed mood.  Pt admitted with depression rating of 10.  Pt to show decreased sign of depression and a rating of 3 or less before d/c.     Attendees: Patient:    Family:    Physician: Dr. Einar Grad; Dr. Shea Evans 01/04/2016 9:30 AM  Nursing: Grayland Ormond, Nelly Rout [place], RN 01/04/2016 9:30 AM  Clinical Social Worker: Tilden Fossa, LCSW 01/04/2016 9:30 AM  Other: Peri Maris, LCSWA; [place], LCSW  01/04/2016 9:30 AM  Other:  01/04/2016 9:30 AM  Other: Lars Pinks, Case Manager 01/04/2016 9:30 AM  Other: Agustina Caroli, May Augustin, NP 01/04/2016 9:30 AM  Other:      Scribe for Treatment Team:  Tilden Fossa, [place]

## 2016-01-04 NOTE — Progress Notes (Signed)
D:  Patient awake, alert and oriented x 4; she is pleasant, calm and cooperative; Affect appropriate to conversation;  Mood depressed; she denies suicidal and homicidal ideation and AVH;  No self-injurious behaviors noted or reported. A:  Scheduled medications given as ordered;  Patient educated on medication regimen.  Encouraged to seek assistance with needs/concerns; emotional support given. R:  Safety maintained on unit.  She remains on q 15 minute safety checks.

## 2016-01-04 NOTE — H&P (Signed)
Psychiatric Admission Assessment Adult  Patient Identification: Carmen Gibbs MRN:  [id number] Date of Evaluation:  01/04/2016 Chief Complaint:  BIPOLAR 1,DEPRESSED,SEVERE Principal Diagnosis: Bipolar 1 disorder, depressed, severe ([place]) Diagnosis:   Patient Active Problem List   Diagnosis Date Noted  . Bipolar 1 disorder, depressed, severe ([place]) [F31.4] 01/03/2016  . Bipolar affective disorder, depressed, severe ([place]) [F31.4] 01/03/2016  . Positive serology for Helicobacter pylori [H70.26] 08/08/2012  . Nicotine addiction [F17.200] 08/01/2012  . BMI 28.0-28.9,adult [Z68.28] 08/01/2012  . Scoliosis [M41.9] 08/01/2012   History of Present Illness:30 year old female with presumed bipolar disorder presents to the emergency department for psychiatric evaluation. Patient was started on Zoloft 3 months ago by her psychiatrist. She no longer sees this provider. She has history of "highs and lows", but has been experiencing worsening depression coupled with anger since Father's Day. Partner believes that this is largely due to the recent passing of her father from bladder cancer. This evening, patient became very depressed. She "smashed everything in the house". Patient making statements that she does not want to live anymore. She does continue to endorse suicidal ideations without plan. No history of behavioral health hospitalizations. Pt reports SI with a plan. Pt will not disclose this plan. Pt states she was in a conflict with her wife and became enraged.   CC:My father passed away in 2023/07/13. I originally got married in October 2014, then my wife's dad got diagnosed with brain tumor. He moved in with [place] for 1 year. My dad was then diagnosed with bladder cancer in march 2016, and he passed in 2023-07-13. Been a complete shit since he passed away. I always self-destruct. I had an affair. Then I stopped talking to my friends except 2. They didn't want to talk to me so why should I talk to them. I  then moved my stuff downstairs. The other night I drove around for hours looking for her, and then she told me she made out(kissed her)  with a girl name Burman Nieves that she works with. I also have a boyfriend that she knows about, and I left the home to go talk to him. I got on good terms with him and then went back home. And it was like a switch flipped, in and out of body experience, I started breaking stuff and she showed no emotions. It was to the point she said I scared her and I knew I needed to go get help. So Im here but at the same time I need to be here, but I miss her. I am safe and comfortable with her, she completes me. But what's crazy is I feel like Ellard Artis completes me too.   Upon evaluation of admission:  Carmen Gibbs is a 30 year old female being admitted voluntarily to [facility] from [facility]. She came to the ED reporting SI with a plan but would not disclose plan. She denies HI or A/V hallucinations. She reported to Probation officer that she is going through a separation with her wife and "this is the first time she lied to me." She became upset and destroyed property at their home. Pt states her sleeping and appetite has been up and down. Pt reports being diagnosed with Bipolar recently and prescribed Zoloft with a recent increase. Pt denies SA. Admission paperwork completed and signed. Belongings searched and no belongings needed to be locked in locker. Skin assessment completed and noted multiple tattoos, 3 small well healed incision sites from gallbladder surgery, belly button ring and both nipples pierced. Q  15 minute checks initiated for safety. We will monitor the progress towards her goals.  Collateral from Wife: We got married in 2014, we have a system of events since our marriage. Her dad had cancer and it was a very progressive cancer so he passed away quickly. Since then Carmen Gibbs has flipped and changed. She said I became cold, and I didn't show her affection. She justified it with meeting a guy that  she works with, and she created an elabourous lie to go hang out with him. She acted as though she was going to go have a girls weekend with co-workers at [employer]. And I was supportive I even gave her spending money. I confronted her and she was like yep I am, and I want a separation. I thought we were doing well and just had some problems with our families being sick. She wasn't communicating. Fast forward her and Ellard Artis are in a full fledge relationship, and she has made it be known. We started going to therapy, and then brandy stopped going to therapy but she kept taking the Zoloft. I went out with co-workers and kissed a girl, and I lied to [place] about it to avoid hurting her feelings. But then she came up with this elaborate lie in her head, and became belligerent for like 2 days. Denies abuse or verbal abuse. She smashed wedding photos and photos of [place] in general, she smashed a glass cane, and was throwing wine glasses. She told me I was dead to her and that I dont exist.    Associated Signs/Symptoms: Depression Symptoms:  depressed mood, insomnia, hypersomnia, psychomotor retardation, fatigue, feelings of worthlessness/guilt, difficulty concentrating, suicidal thoughts with specific plan, anxiety, disturbed sleep, (Hypo) Manic Symptoms:  Distractibility, Elevated Mood, Financial Extravagance, Impulsivity, Irritable Mood, Labiality of Mood, Sexually Inapproprite Behavior, Anxiety Symptoms:  Excessive Worry, Obsessive Compulsive Symptoms:   thoughts, Psychotic Symptoms:   PTSD Symptoms: Negative Total Time spent with patient: 30 minutes  Past Psychiatric History: Depression, bipolar II diagnoses pending. She was seeing Dr. Ellene Route, however due to conflict in work schedule she was not able to continue to see him since last month when he increased her Zoloft from 78m to 1027m   Is the patient at risk to self? Yes.    Has the patient been a risk to self in the past 6 months?  Yes.    Has the patient been a risk to self within the distant past? No.  Is the patient a risk to others? No.  Has the patient been a risk to others in the past 6 months? No.  Has the patient been a risk to others within the distant past? No.   Prior Inpatient Therapy:  None Prior Outpatient Therapy:  Dr. JoEllene RouteAlcohol Screening: 1. How often do you have a drink containing alcohol?: 2 to 4 times a month 2. How many drinks containing alcohol do you have on a typical day when you are drinking?: 1 or 2 3. How often do you have six or more drinks on one occasion?: Never Preliminary Score: 0 9. Have you or someone else been injured as a result of your drinking?: No 10. Has a relative or friend or a doctor or another health worker been concerned about your drinking or suggested you cut down?: No Alcohol Use Disorder Identification Test Final Score (AUDIT): 2 Brief Intervention: AUDIT score less than 7 or less-screening does not suggest unhealthy drinking-brief intervention not indicated Substance Abuse History  in the last 12 months:  No. Consequences of Substance Abuse: Negative Previous Psychotropic Medications: Yes  Psychological Evaluations: Yes  Past Medical History:  Past Medical History  Diagnosis Date  . Scoliosis   . Allergy     Past Surgical History  Procedure Laterality Date  . Wisdom tooth extraction     Family History:  Family History  Problem Relation Age of Onset  . Diabetes Mother   . Hypertension Father    Family Psychiatric  History: Sister has history of PTSD, OCD, MDD, stable on Zoloft. Dad had Bipoalr characteristics but was undiagnosed.  Tobacco Screening: Negative Social History:  History  Alcohol Use  . 2.4 oz/week  . 2 Glasses of wine, 2 Cans of beer per week     History  Drug Use No    Additional Social History:      Pain Medications: Pt denies Prescriptions: Zoloft Over the Counter: Pt denies History of alcohol / drug use?: No history  of alcohol / drug abuse Longest period of sobriety (when/how long): NA    Allergies:   Allergies  Allergen Reactions  . Ciprofloxacin Hives  . Latex Swelling and Rash   Lab Results:  Results for orders placed or performed during the hospital encounter of 01/03/16 (from the past 48 hour(s))  Comprehensive metabolic panel     Status: Abnormal   Collection Time: 01/03/16  2:53 AM  Result Value Ref Range   Sodium 139 135 - 145 mmol/L   Potassium 3.8 3.5 - 5.1 mmol/L   Chloride 107 101 - 111 mmol/L   CO2 24 22 - 32 mmol/L   Glucose, Bld 105 (H) 65 - 99 mg/dL   BUN 11 6 - 20 mg/dL   Creatinine, Ser 0.68 0.44 - 1.00 mg/dL   Calcium 9.7 8.9 - 10.3 mg/dL   Total Protein 8.1 6.5 - 8.1 g/dL   Albumin 5.0 3.5 - 5.0 g/dL   AST 21 15 - 41 U/L   ALT 26 14 - 54 U/L   Alkaline Phosphatase 64 38 - 126 U/L   Total Bilirubin 0.9 0.3 - 1.2 mg/dL   GFR calc non Af Amer >60 >60 mL/min   GFR calc Af Amer >60 >60 mL/min    Comment: (NOTE) The eGFR has been calculated using the CKD EPI equation. This calculation has not been validated in all clinical situations. eGFR's persistently <60 mL/min signify possible Chronic Kidney Disease.    Anion gap 8 5 - 15  Ethanol     Status: None   Collection Time: 01/03/16  2:53 AM  Result Value Ref Range   Alcohol, Ethyl (B) <5 <5 mg/dL    Comment:        LOWEST DETECTABLE LIMIT FOR SERUM ALCOHOL IS 5 mg/dL FOR MEDICAL PURPOSES ONLY   Salicylate level     Status: None   Collection Time: 01/03/16  2:53 AM  Result Value Ref Range   Salicylate Lvl <2.2 2.8 - 30.0 mg/dL  Acetaminophen level     Status: Abnormal   Collection Time: 01/03/16  2:53 AM  Result Value Ref Range   Acetaminophen (Tylenol), Serum <10 (L) 10 - 30 ug/mL    Comment:        THERAPEUTIC CONCENTRATIONS VARY SIGNIFICANTLY. A RANGE OF 10-30 ug/mL MAY BE AN EFFECTIVE CONCENTRATION FOR MANY PATIENTS. HOWEVER, SOME ARE BEST TREATED AT CONCENTRATIONS OUTSIDE THIS RANGE. ACETAMINOPHEN  CONCENTRATIONS >150 ug/mL AT 4 HOURS AFTER INGESTION AND >50 ug/mL AT 12 HOURS AFTER INGESTION  ARE OFTEN ASSOCIATED WITH TOXIC REACTIONS.   cbc     Status: Abnormal   Collection Time: 01/03/16  2:53 AM  Result Value Ref Range   WBC 12.4 (H) 4.0 - 10.5 K/uL   RBC 4.61 3.87 - 5.11 MIL/uL   Hemoglobin 14.2 12.0 - 15.0 g/dL   HCT 41.1 36.0 - 46.0 %   MCV 89.2 78.0 - 100.0 fL   MCH 30.8 26.0 - 34.0 pg   MCHC 34.5 30.0 - 36.0 g/dL   RDW 13.2 11.5 - 15.5 %   Platelets 278 150 - 400 K/uL  Rapid urine drug screen (hospital performed)     Status: None   Collection Time: 01/03/16  3:05 AM  Result Value Ref Range   Opiates NONE DETECTED NONE DETECTED   Cocaine NONE DETECTED NONE DETECTED   Benzodiazepines NONE DETECTED NONE DETECTED   Amphetamines NONE DETECTED NONE DETECTED   Tetrahydrocannabinol NONE DETECTED NONE DETECTED   Barbiturates NONE DETECTED NONE DETECTED    Comment:        DRUG SCREEN FOR MEDICAL PURPOSES ONLY.  IF CONFIRMATION IS NEEDED FOR ANY PURPOSE, NOTIFY LAB WITHIN 5 DAYS.        LOWEST DETECTABLE LIMITS FOR URINE DRUG SCREEN Drug Class       Cutoff (ng/mL) Amphetamine      1000 Barbiturate      200 Benzodiazepine   595 Tricyclics       638 Opiates          300 Cocaine          300 THC              50   Pregnancy, urine     Status: None   Collection Time: 01/03/16  3:05 AM  Result Value Ref Range   Preg Test, Ur NEGATIVE NEGATIVE    Comment:        THE SENSITIVITY OF THIS METHODOLOGY IS >20 mIU/mL.     Blood Alcohol level:  Lab Results  Component Value Date   ETH <5 75/64/3329    Metabolic Disorder Labs:  No results found for: HGBA1C, MPG No results found for: PROLACTIN No results found for: CHOL, TRIG, HDL, CHOLHDL, VLDL, LDLCALC  Current Medications: Current Facility-Administered Medications  Medication Dose Route Frequency Provider Last Rate Last Dose  . acetaminophen (TYLENOL) tablet 650 mg  650 mg Oral Q6H PRN Patrecia Pour, NP   650  mg at 01/04/16 0411  . alum & mag hydroxide-simeth (MAALOX/MYLANTA) 200-200-20 MG/5ML suspension 30 mL  30 mL Oral Q4H PRN Patrecia Pour, NP      . hydrOXYzine (ATARAX/VISTARIL) tablet 25 mg  25 mg Oral Q6H PRN Patrecia Pour, NP      . magnesium hydroxide (MILK OF MAGNESIA) suspension 30 mL  30 mL Oral Daily PRN Patrecia Pour, NP      . nicotine (NICODERM CQ - dosed in mg/24 hours) patch 21 mg  21 mg Transdermal Daily Patrecia Pour, NP   21 mg at 01/04/16 0806  . ondansetron (ZOFRAN) tablet 4 mg  4 mg Oral Q8H PRN Patrecia Pour, NP      . QUEtiapine (SEROQUEL) tablet 25 mg  25 mg Oral BID Himabindu Ravi, MD      . traZODone (DESYREL) tablet 50 mg  50 mg Oral QHS Patrecia Pour, NP   50 mg at 01/03/16 2236   PTA Medications: Prescriptions prior to admission  Medication Sig Dispense Refill Last  Dose  . acetaminophen (TYLENOL) 500 MG tablet Take 1,000 mg by mouth every 6 (six) hours as needed for mild pain.   Past Week at Unknown time  . cetirizine (ZYRTEC) 10 MG tablet Take 10 mg by mouth daily.   01/02/2016 at Unknown time  . medroxyPROGESTERone (DEPO-PROVERA) 150 MG/ML injection Inject 150 mg into the muscle every 3 (three) months.   Past Month at Unknown time  . sertraline (ZOLOFT) 100 MG tablet Take 100 mg by mouth daily.   01/02/2016 at 1200    Musculoskeletal: Strength & Muscle Tone: within normal limits Gait & Station: normal Patient leans: N/A  Psychiatric Specialty Exam: SEE MD SRA Physical Exam  ROS  Blood pressure 106/74, pulse 94, temperature 98.2 F (36.8 C), temperature source Oral, resp. rate 16, height 5' 11" (1.803 m), weight 80.287 kg (177 lb), last menstrual period 12/27/2015, SpO2 100 %.Body mass index is 24.7 kg/(m^2).  General Appearance: Casual and Fairly Groomed  Eye Contact:  Fair  Speech:  Clear and Coherent and Normal Rate  Volume:  Normal  Mood:  Depressed  Affect:  Depressed, Flat and Tearful  Thought Process:  Coherent and Goal Directed  Orientation:   Full (Time, Place, and Person)  Thought Content:  Logical and Rumination  Suicidal Thoughts:  Yes.  with intent/plan will not disclose, but is able to contract for safety.   Homicidal Thoughts:  No  Memory:  Immediate;   Fair Recent;   Fair  Judgement:  Impaired  Insight:  Shallow  Psychomotor Activity:  Normal  Concentration:  Concentration: Good and Attention Span: Good  Recall:  Good  Fund of Knowledge:  Good  Language:  Good  Akathisia:  No  Handed:  Right  AIMS (if indicated):     Assets:  Communication Skills Desire for Improvement Financial Resources/Insurance Housing Intimacy Leisure Time Physical Health Social Support Talents/Skills Transportation Vocational/Educational  ADL's:  Intact  Cognition:  WNL  Sleep:  Number of Hours: 6.75    Treatment Plan Summary: Daily contact with patient to assess and evaluate symptoms and progress in treatment and Medication management  Plan: Review of chart, vital signs, medications, and notes.  1-Admit for crisis management and stabilization. Estimated length of stay 3-5 days past her current stay of 1  2-Individual and group therapy encouraged.  3-Medication management for depression and anxiety to reduce current symptoms to base line and improve the patient's overall level of functioning: Medications reviewed with the patient and benadryl 25 mg po added for sleep issues and  PRN anxiety. Will resume home medication. Will increase Zoloft 136m po daily to target depressive symptoms and OCD symptoms. Will start Seroquel 274mpo BID. Discussed with patient may eventually need mood stabilizer, but for now will use as a later resort.  Will start Benadryl 2533mo q6 hr prn for agitation/anxiety and EPS like symptoms if needed.  4-Coping skills for depression, substance abuse, anger issues, and anxiety developing--  5-Continue crisis stabilization and management  6-Address health issues--monitoring vital signs, stable 7-Treatment plan  in progress to prevent relapse of depression, angry outbursts, and anxiety  8-Psychosocial education regarding relapse prevention and self-care  9-Health care follow up as needed for any health concerns  10-Call for consult with hospitalist for additional specialty patient services as needed.  Observation Level/Precautions:  15 minute checks  Laboratory:  Labs obtained in the ED have been reviewed and assessed.   Psychotherapy:  Individual and group therapy  Medications:  See above. Will resume  home medication. Will increase Zoloft 137m po daily to target depressive symptoms and OCD symptoms. Will start Seroquel 271mpo BID  for bipolar I. Discussed with patient  may eventually need mood stabilizer, but for now will use as a later resort.  Will start Benadryl 254mo q6 hr prn for agitation/anxiety and EPS like symptoms if needed.   Consultations:  Per need  Discharge Concerns:  Safety and medication compliance  Estimated LOS: 3-5 days.   Other:     I certify that inpatient services furnished can reasonably be expected to improve the patient's condition.    TakNanci PinaNP 6/23/20171:27 PM

## 2016-01-04 NOTE — [facility] Group Notes (Signed)
[facility] LCSW Group Therapy 01/04/2016 1:15 PM Type of Therapy: Group Therapy Participation Level: Active  Participation Quality: Attentive, Sharing and Supportive  Affect: Depressed  Cognitive: Alert and Oriented  Insight: Developing/Improving and Engaged  Engagement in Therapy: Developing/Improving and Engaged  Modes of Intervention: Clarification, Confrontation, Discussion, Education, Exploration, Limit-setting, Orientation, Problem-solving, Rapport Building, Dance movement psychotherapisteality Testing, Socialization and Support  Summary of Progress/Problems: The topic for today was feelings about relapse. Pt discussed what relapse prevention is to them and identified triggers that they are on the path to relapse. Pt processed their feeling towards relapse and was able to relate to peers. Pt discussed coping skills that can be used for relapse prevention. Patient identified her wife as her trigger but reports that her wife is supportive and treats her well. She expressed difficulty understanding why she feels so labile around her wife. She acknowledges that her father's death approximately 6 months ago has been very difficult for her and that she does not know how to grieve. She feels like she has been a "strong rock" for her family during this time and that is finally "crumbling". She states that it is time for her to be selfish and make herself a priority. CSW and other group members provided patient with emotional support and encouragement.    Samuella BruinKristin Jaidee Stipe, MSW, LCSW Clinical Social Worker [facility] [phone]

## 2016-01-05 DIAGNOSIS — F314 Bipolar disorder, current episode depressed, severe, without psychotic features: Secondary | ICD-10-CM

## 2016-01-05 NOTE — [facility] Group Notes (Signed)
[facility] Group Notes:  (Nursing/MHT/Case Management/Adjunct)  Date:  01/05/2016  Time:  0930 am  Type of Therapy:  Psychoeducational Skills  Participation Level:  Did Not Attend  Patient invited; declined to attend.   Cranford MonBeaudry, Jenessa Gillingham Evans 01/05/2016, 10:01 AM

## 2016-01-05 NOTE — [facility] Group Notes (Signed)
[facility] LCSW Group Therapy  01/05/2016 10:00 AM  Type of Therapy:  Group Therapy  Participation Level:  Did Not Attend   Beverly SessionsLINDSEY, Tab Rylee J 01/05/2016, 12:40 PM

## 2016-01-05 NOTE — Progress Notes (Signed)
[facility]  01/05/2016 12:59 PM Carmen SauceBrandi Jade Gibbs  MRN:  [id number] Subjective:  "I feel more even, calm, normal as Ive ever been before.  I think better and my racing thoughts have almost gone away." Objective: Carmen Gibbs came in to ED with reported history of bipolar disorder.  She was started on Zoloft 3 months ago by her psychiatrist and it was not working. She states that she has been under a tremendous amount of pressure and stress after dealing with her father's death and taking care of terminally ill father in law.   Principal Problem: Bipolar 1 disorder, depressed, severe (HCC) Diagnosis:   Patient Active Problem List   Diagnosis Date Noted  . Bipolar 1 disorder, depressed, severe (HCC) [F31.4] 01/03/2016  . Bipolar affective disorder, depressed, severe (HCC) [F31.4] 01/03/2016  . Positive serology for Helicobacter pylori [B96.81] 08/08/2012  . Nicotine addiction [F17.200] 08/01/2012  . BMI 28.0-28.9,adult [Z68.28] 08/01/2012  . Scoliosis [M41.9] 08/01/2012   Total Time spent with patient: 30 minutes  Past Psychiatric History: see HPI   Past Medical History:  Past Medical History  Diagnosis Date  . Scoliosis   . Allergy     Past Surgical History  Procedure Laterality Date  . Wisdom tooth extraction     Family History:  Family History  Problem Relation Age of Onset  . Diabetes Mother   . Hypertension Father    Family Psychiatric  History: see HPI Social History:  History  Alcohol Use  . 2.4 oz/week  . 2 Glasses of wine, 2 Cans of beer per week     History  Drug Use No    Social History   Social History  . Marital Status: Married    Spouse Name: N/A  . Number of Children: N/A  . Years of Education: N/A   Social History Main Topics  . Smoking status: Current Some Day Smoker    Types: Cigarettes  . Smokeless tobacco: Never Used  . Alcohol Use: 2.4 oz/week    2 Glasses of wine, 2 Cans of beer per week  . Drug Use: No  . Sexual Activity: Yes     Birth Control/ Protection: None   Other Topics Concern  . None   Social History Narrative   Additional Social History:    Pain Medications: Pt denies Prescriptions: Zoloft Over the Counter: Pt denies History of alcohol / drug use?: No history of alcohol / drug abuse Longest period of sobriety (when/how long): NA                    Sleep: Good  Appetite:  Good  Current Medications: Current Facility-Administered Medications  Medication Dose Route Frequency Provider Last Rate Last Dose  . acetaminophen (TYLENOL) tablet 650 mg  650 mg Oral Q6H PRN Charm RingsJamison Y Lord, NP   650 mg at 01/04/16 0411  . alum & mag hydroxide-simeth (MAALOX/MYLANTA) 200-200-20 MG/5ML suspension 30 mL  30 mL Oral Q4H PRN Charm RingsJamison Y Lord, NP      . diphenhydrAMINE (BENADRYL) capsule 25 mg  25 mg Oral Q6H PRN Truman Haywardakia S Starkes, FNP      . magnesium hydroxide (MILK OF MAGNESIA) suspension 30 mL  30 mL Oral Daily PRN Charm RingsJamison Y Lord, NP      . nicotine (NICODERM CQ - dosed in mg/24 hours) patch 21 mg  21 mg Transdermal Daily Charm RingsJamison Y Lord, NP   21 mg at 01/05/16 0757  . ondansetron (ZOFRAN) tablet 4 mg  4 mg Oral Q8H PRN Charm Rings, NP      . QUEtiapine (SEROQUEL) tablet 25 mg  25 mg Oral BID Himabindu Ravi, MD   25 mg at 01/05/16 0757  . sertraline (ZOLOFT) tablet 150 mg  150 mg Oral Daily Truman Hayward, FNP   150 mg at 01/05/16 0757  . traZODone (DESYREL) tablet 50 mg  50 mg Oral QHS Charm Rings, NP   50 mg at 01/04/16 2141    Lab Results: No results found for this or any previous visit (from the past 48 hour(s)).  Blood Alcohol level:  Lab Results  Component Value Date   ETH <5 01/03/2016    Metabolic Disorder Labs: No results found for: HGBA1C, MPG No results found for: PROLACTIN No results found for: CHOL, TRIG, HDL, CHOLHDL, VLDL, LDLCALC  Physical Findings: AIMS: Facial and Oral Movements Muscles of Facial Expression: None, normal Lips and Perioral Area: None, normal Jaw: None,  normal Tongue: None, normal,Extremity Movements Upper (arms, wrists, hands, fingers): None, normal Lower (legs, knees, ankles, toes): None, normal, Trunk Movements Neck, shoulders, hips: None, normal, Overall Severity Severity of abnormal movements (highest score from questions above): None, normal Incapacitation due to abnormal movements: None, normal Patient's awareness of abnormal movements (rate only patient's report): No Awareness, Dental Status Current problems with teeth and/or dentures?: No Does patient usually wear dentures?: No  CIWA:    COWS:     Musculoskeletal: Strength & Muscle Tone: within normal limits Gait & Station: normal Patient leans: N/A  Psychiatric Specialty Exam: Physical Exam  Nursing note and vitals reviewed. Psychiatric: Her mood appears anxious. Thought content is not paranoid. She does not exhibit a depressed mood. She expresses no homicidal and no suicidal ideation.    Review of Systems  Psychiatric/Behavioral: Negative for depression, hallucinations and substance abuse. The patient is not nervous/anxious.   All other systems reviewed and are negative.   Blood pressure 105/62, pulse 89, temperature 98.4 F (36.9 C), temperature source Oral, resp. rate 16, height  (1.803 m), weight 80.287 kg (177 lb), last menstrual period 12/27/2015, SpO2 100 %.Body mass index is 24.7 kg/(m^2).  General Appearance: Neat  Eye Contact:  Good  Speech:  Clear and Coherent  Volume:  Normal  Mood:  Euthymic  Affect:  Appropriate and Congruent  Thought Process:  Coherent and Goal Directed  Orientation:  Full (Time, Place, and Person)  Thought Content:  Rumination  Suicidal Thoughts:  No  Homicidal Thoughts:  No  Memory:  Immediate;   Fair Recent;   Fair Remote;   Fair  Judgement:  Fair  Insight:  Fair  Psychomotor Activity:  Normal  Concentration:  Concentration: Good and Attention Span: Good  Recall:  Good  Fund of Knowledge:  Fair  Language:  Fair   Akathisia:  Negative  Handed:  Right  AIMS (if indicated):     Assets:  Communication Skills Desire for Improvement Resilience Social Support  ADL's:  Intact  Cognition:  WNL  Sleep:  Number of Hours: 6.5   Treatment Plan Summary: Review of chart, vital signs, medications, and notes.  1-Individual and group therapy  2-Medication management for depression and anxiety: Medications reviewed with the patient.  Seroquel 25 mg BID and 50 mg betime for mood stabilization, Sertraline 150 mg depression, Trazodone 50 mg insomnia 3-Coping skills for depression, anxiety  4-Continue crisis stabilization and management  5-Address health issues--monitoring vital signs, stable  6-Treatment plan in progress to prevent relapse of depression  and anxiety  Lindwood QuaSheila May Siearra Amberg, NP [facility] 01/05/2016, 12:59 PM

## 2016-01-05 NOTE — Progress Notes (Signed)
D: Patient is visible on the unit.  She has a tendency to isolate to her room.  She is pleasant upon approach.  She rates her depression, anxiety and hopelessness as a 0.  She denies any self harm thoughts or HI/AVH.  Her goal today is to "stay awake and beat the sleeplessness from my meds."  Patient was somewhat upset over an incident in the cafeteria this morning.  She was able to talk about it with staff and was assured that it was an isolated incident.   A: Continue to monitor medication management and MD orders.  Safety checks completed every 15 minutes per protocol. Offer support and encouragement as needed. R: Patient is receptive to staff; her behavior is appropriate.

## 2016-01-06 NOTE — Progress Notes (Signed)
[facility] MD Progress Note  01/06/2016 12:00 PM Carmen Gibbs  MRN:  [id number]   Subjective:  "I think it's better for me to go today.  I need a day to rest before I gp back to work."  Objective: Carmen Gibbs came in to ED with reported history of bipolar disorder.  She was started on Zoloft 3 months ago by her psychiatrist and it was not working. She states that she has been under a tremendous amount of pressure and stress after dealing with her father's death and taking care of terminally ill father in law.  She appears to be responding well to her medications.  She reports that her wife and mother had came to visit her the last 2 nights and  She felt supported by them.    Principal Problem: Bipolar 1 disorder, depressed, severe (HCC) Diagnosis:   Patient Active Problem List   Diagnosis Date Noted  . Bipolar 1 disorder, depressed, severe (HCC) [F31.4] 01/03/2016  . Bipolar affective disorder, depressed, severe (HCC) [F31.4] 01/03/2016  . Positive serology for Helicobacter pylori [B96.81] 08/08/2012  . Nicotine addiction [F17.200] 08/01/2012  . BMI 28.0-28.9,adult [Z68.28] 08/01/2012  . Scoliosis [M41.9] 08/01/2012   Total Time spent with patient: 30 minutes  Past Psychiatric History: see HPI   Past Medical History:  Past Medical History  Diagnosis Date  . Scoliosis   . Allergy     Past Surgical History  Procedure Laterality Date  . Wisdom tooth extraction     Family History:  Family History  Problem Relation Age of Onset  . Diabetes Mother   . Hypertension Father    Family Psychiatric  History: see HPI Social History:  History  Alcohol Use  . 2.4 oz/week  . 2 Glasses of wine, 2 Cans of beer per week     History  Drug Use No    Social History   Social History  . Marital Status: Married    Spouse Name: N/A  . Number of Children: N/A  . Years of Education: N/A   Social History Main Topics  . Smoking status: Current Some Day Smoker    Types: Cigarettes  .  Smokeless tobacco: Never Used  . Alcohol Use: 2.4 oz/week    2 Glasses of wine, 2 Cans of beer per week  . Drug Use: No  . Sexual Activity: Yes    Birth Control/ Protection: None   Other Topics Concern  . None   Social History Narrative   Additional Social History:    Pain Medications: Pt denies Prescriptions: Zoloft Over the Counter: Pt denies History of alcohol / drug use?: No history of alcohol / drug abuse Longest period of sobriety (when/how long): NA  Sleep: Good  Appetite:  Good  Current Medications: Current Facility-Administered Medications  Medication Dose Route Frequency Provider Last Rate Last Dose  . acetaminophen (TYLENOL) tablet 650 mg  650 mg Oral Q6H PRN Charm Rings, NP   650 mg at 01/04/16 0411  . alum & mag hydroxide-simeth (MAALOX/MYLANTA) 200-200-20 MG/5ML suspension 30 mL  30 mL Oral Q4H PRN Charm Rings, NP      . diphenhydrAMINE (BENADRYL) capsule 25 mg  25 mg Oral Q6H PRN Truman Hayward, FNP      . magnesium hydroxide (MILK OF MAGNESIA) suspension 30 mL  30 mL Oral Daily PRN Charm Rings, NP      . nicotine (NICODERM CQ - dosed in mg/24 hours) patch 21 mg  21 mg Transdermal Daily  Charm RingsJamison Y Lord, NP   21 mg at 01/06/16 16100822  . ondansetron (ZOFRAN) tablet 4 mg  4 mg Oral Q8H PRN Charm RingsJamison Y Lord, NP      . QUEtiapine (SEROQUEL) tablet 25 mg  25 mg Oral BID Himabindu Ravi, MD   25 mg at 01/06/16 0820  . sertraline (ZOLOFT) tablet 150 mg  150 mg Oral Daily Truman Haywardakia S Starkes, FNP   150 mg at 01/06/16 0819  . traZODone (DESYREL) tablet 50 mg  50 mg Oral QHS Charm RingsJamison Y Lord, NP   50 mg at 01/05/16 2121    Lab Results: No results found for this or any previous visit (from the past 48 hour(s)).  Blood Alcohol level:  Lab Results  Component Value Date   ETH <5 01/03/2016    Metabolic Disorder Labs: No results found for: HGBA1C, MPG No results found for: PROLACTIN No results found for: CHOL, TRIG, HDL, CHOLHDL, VLDL, LDLCALC  Physical  Findings: AIMS: Facial and Oral Movements Muscles of Facial Expression: None, normal Lips and Perioral Area: None, normal Jaw: None, normal Tongue: None, normal,Extremity Movements Upper (arms, wrists, hands, fingers): None, normal Lower (legs, knees, ankles, toes): None, normal, Trunk Movements Neck, shoulders, hips: None, normal, Overall Severity Severity of abnormal movements (highest score from questions above): None, normal Incapacitation due to abnormal movements: None, normal Patient's awareness of abnormal movements (rate only patient's report): No Awareness, Dental Status Current problems with teeth and/or dentures?: No Does patient usually wear dentures?: No  CIWA:    COWS:     Musculoskeletal: Strength & Muscle Tone: within normal limits Gait & Station: normal Patient leans: N/A  Psychiatric Specialty Exam: Physical Exam  Nursing note and vitals reviewed. Psychiatric: Her mood appears anxious. Thought content is not paranoid. She does not exhibit a depressed mood. She expresses no homicidal and no suicidal ideation.    Review of Systems  Psychiatric/Behavioral: Negative for depression, hallucinations and substance abuse. The patient is not nervous/anxious.   All other systems reviewed and are negative.   Blood pressure 131/85, pulse 81, temperature 98.2 F (36.8 C), temperature source Oral, resp. rate 16, height 5\' 11"  (1.803 m), weight 80.287 kg (177 lb), last menstrual period 12/27/2015, SpO2 100 %.Body mass index is 24.7 kg/(m^2).  General Appearance: Neat  Eye Contact:  Good  Speech:  Clear and Coherent  Volume:  Normal  Mood:  Euthymic  Affect:  Appropriate and Congruent  Thought Process:  Coherent and Goal Directed  Orientation:  Full (Time, Place, and Person)  Thought Content:  Rumination  Suicidal Thoughts:  No  Homicidal Thoughts:  No  Memory:  Immediate;   Fair Recent;   Fair Remote;   Fair  Judgement:  Fair  Insight:  Fair  Psychomotor Activity:   Normal  Concentration:  Concentration: Good and Attention Span: Good  Recall:  Good  Fund of Knowledge:  Fair  Language:  Fair  Akathisia:  Negative  Handed:  Right  AIMS (if indicated):     Assets:  Communication Skills Desire for Improvement Resilience Social Support  ADL's:  Intact  Cognition:  WNL  Sleep:  Number of Hours: 6.5   Treatment Plan Summary: Review of chart, vital signs, medications, and notes.  1-Individual and group therapy  2-Medication management for depression and anxiety: Medications reviewed with the patient.   Seroquel 25 mg BID and 50 mg QHS for mood stabilization, Sertraline 150 mg depression, Trazodone 50 mg insomnia 3-Coping skills for depression, anxiety  4-Continue crisis  stabilization and management  5-Address health issues--monitoring vital signs, stable  6-Treatment plan in progress to prevent relapse of depression and anxiety.  Lindwood QuaSheila May Agustin, NP [facility] 01/06/2016, 12:00 PM  Reviewed the information documented and agree with the treatment plan.  Johndaniel Catlin 01/06/2016 2:23 PM

## 2016-01-06 NOTE — Progress Notes (Signed)
D). Patient denies SI/HI/AVH. Calm and cooperative.  Patient interacting well with staff and other patients. Attending groups. Patient took a bath this shift and she reports that it helped her, "mental outlook." On self inventory, patient reports 0/10 for depression, hopelessness and anxiety.  A). Emotional support and encouragement offered. Education provided on medication, indications and side effect. Q 15 minute checks done for safety. R). Continue to maintain safety checks. Continue to take medications as prescribed. Continue to complete self inventory and attend groups.

## 2016-01-06 NOTE — Plan of Care (Signed)
Problem: Education: Goal: Knowledge of the prescribed therapeutic regimen will improve Outcome: Progressing Patient was able to identify her medications and their indications.

## 2016-01-06 NOTE — [facility] Group Notes (Signed)
[facility] LCSW Group Therapy  01/06/2016 10:00 AM  Type of Therapy:  Group Therapy  Participation Level:  Active  Participation Quality:  Appropriate, Attentive, Sharing and Supportive  Affect:  Appropriate  Cognitive:  Alert, Appropriate and Oriented  Insight:  Improving  Engagement in Therapy:  Engaged  Modes of Intervention:  Discussion  Summary of Progress/Problems: Group began discussing negative self talk. However, patient's identified that had discussed negative self talk. Patient's identified that previous group was about negative self talk. This clinician then asked about their individual plans to address negative self talk. Patients identified need for specific plan to accomplish this goal. Group began providing details for goals identified by each participant. Patients eager to provide encouragement, support and goal plans. Patient identified goal of wanting to share with her family about her mental illness. Group provided good ideas including shring pamphlets, family counseling, and providing relatable physical examples.   Beverly SessionsLINDSEY, Nakia Koble J 01/06/2016, 2:52 PM

## 2016-01-06 NOTE — Progress Notes (Signed)
Patient has been up and active on the unit, attended group this evening and has voiced no complaints.She is hopeful to discharge on tomorrow she reports that she would like to prepare for work on Tuesday. She reports that her mother will be staying with her for a while. Patient currently denies having pain, -si/hi/a/v hall. Support and encouragement offered, safety maintained on unit, will continue to monitor.

## 2016-01-06 NOTE — [facility] Group Notes (Signed)
[facility] Group Notes:  (Nursing/MHT/Case Management/Adjunct)  Date:  01/06/2016  Time:  10:29 AM  Type of Therapy:  Nurse Education  Participation Level:  Active  Participation Quality:  Appropriate, Attentive and Sharing  Affect:  Appropriate  Cognitive:  Alert, Appropriate and Oriented  Insight:  Appropriate  Engagement in Group:  Engaged  Modes of Intervention:  Discussion  Summary of Progress/Problems: Patient was appropriate and supportive throughout group. She was able to share in appropriate manner. Her goal is: "Make sure my mom brings me a watch today. I need it to be able to have it remind me of my medications, so I can stay on my medication schedule."  Carmen Gibbs G Jullie Arps 01/06/2016, 10:29 AM

## 2016-01-06 NOTE — Progress Notes (Signed)
D:Patient in the hallway on approach.  Patient states she is supposed to discharge tomorrow.  Patient states her mind is more clear and she states she is no longer having racing thoughts.  Patient states she plans to go home to live with her wife but states she still has a boyfriend.  Patient denies depression and anxiety.  Patient completed her suicide safety plan tonight.  Patient denies SI/HI and denies AVH. A: Staff to monitor Q 15 mins for safety.  Encouragement and support offered.  Scheduled medications administered per orders. R: Patient remains safe on the unit.  Patient attended group tonight.  Patient visible on the unit and interacting with peers.  Patient taking administered medications.

## 2016-01-07 MED ORDER — TRAZODONE HCL 50 MG PO TABS: 50.0000 mg | ORAL_TABLET | Freq: Every day | ORAL | Status: DC

## 2016-01-07 MED ORDER — ACETAMINOPHEN 500 MG PO TABS: 1000.0000 mg | ORAL_TABLET | Freq: Four times a day (QID) | ORAL | Status: DC | PRN

## 2016-01-07 MED ORDER — QUETIAPINE FUMARATE 25 MG PO TABS: 25.0000 mg | ORAL_TABLET | Freq: Two times a day (BID) | ORAL | Status: DC

## 2016-01-07 MED ORDER — SERTRALINE HCL 50 MG PO TABS: 150.0000 mg | ORAL_TABLET | Freq: Every day | ORAL | Status: DC

## 2016-01-07 NOTE — Progress Notes (Signed)
Patient ID: Carmen Gibbs, female   DOB: [birth year], [age] y.o.   MRN: [id number]   Pt currently presents with an anxious affect and guarded behavior. Per self inventory, pt rates depression, hopelessness and anxiety at a 0. Pt's daily goal is to "get started after discharge" and they intend to do so by "have support from my wife and mom." Pt reports good sleep, a good appetite, normal energy and good concentration. Pt requests to be discharged early this morning.   Pt provided with medications per providers orders. Pt's labs and vitals were monitored throughout the day. Pt supported emotionally and encouraged to express concerns and questions. Pt educated on medications and suicide prevention resources.  Pt's safety ensured with 15 minute and environmental checks. Pt currently denies SI/HI and A/V hallucinations. Pt verbally agrees to seek staff if SI/HI or A/VH occurs and to consult with staff before acting on any harmful thoughts.  Will continue POC.

## 2016-01-07 NOTE — Discharge Summary (Signed)
Physician Discharge Summary Note  Patient:  Carmen Gibbs is an 30 y.o., female MRN:  [id number] DOB:  21-Apr-1986 Patient phone:  [phone] (home)  Patient address:   [address] [place] [id number],  Total Time spent with patient: Greater than 30 minutes  Date of Admission:  01/03/2016 Date of Discharge: 01-07-16  Reason for Admission: Worsening symptoms of Bipolar disorder  Principal Problem: Bipolar 1 disorder, depressed, severe [facility](HCC)  Discharge Diagnoses: Patient Active Problem List   Diagnosis Date Noted  . Bipolar 1 disorder, depressed, severe (HCC) [F31.4] 01/03/2016  . Positive serology for Helicobacter pylori [B96.81] 08/08/2012  . BMI 28.0-28.9,adult [Z68.28] 08/01/2012  . Scoliosis [M41.9] 08/01/2012   Past Psychiatric History: Bipolar disorder  Past Medical History:  Past Medical History  Diagnosis Date  . Scoliosis   . Allergy     Past Surgical History  Procedure Laterality Date  . Wisdom tooth extraction     Family History:  Family History  Problem Relation Age of Onset  . Diabetes Mother   . Hypertension Father    Family Psychiatric  History: See H&P  Social History:  History  Alcohol Use  . 2.4 oz/week  . 2 Glasses of wine, 2 Cans of beer per week     History  Drug Use No    Social History   Social History  . Marital Status: Married    Spouse Name: N/A  . Number of Children: N/A  . Years of Education: N/A   Social History Main Topics  . Smoking status: Current Some Day Smoker    Types: Cigarettes  . Smokeless tobacco: Never Used  . Alcohol Use: 2.4 oz/week    2 Glasses of wine, 2 Cans of beer per week  . Drug Use: No  . Sexual Activity: Yes    Birth Control/ Protection: None   Other Topics Concern  . None   Social History Narrative   Hospital Course:  30 year old female with presumed bipolar disorder presents to the emergency department for psychiatric evaluation. Patient was started on Zoloft 3 months ago  by her psychiatrist. She no longer sees this provider. She has history of "highs and lows", but has been experiencing worsening depression coupled with anger since Father's Day. Partner believes that this is largely due to the recent passing of her father from bladder cancer. This evening, patient became very depressed. She "smashed everything in the house". Patient making statements that she does not want to live anymore. She does continue to endorse suicidal ideations without plan. No history of behavioral health hospitalizations. Pt reports SI with a plan. Pt will not disclose this plan. Pt states she was in a conflict with her wife and became enraged.   C[facility] for worsening symptoms of Bipolar I disorder, most recent episode, depressed. She reported on admission that she was started on Zoloft about 3 months ago by another provider on an outpatient basis for depression. She reported other stressors in her life such as losing a father, relationship issues & worsening feelings of high & lows. She reported episodes of being enraged & smashing things in her house. She required mood stabilization treatment. Carmen Gibbs was medicated & discharged on; Seroquel 25 mg for mood control, Sertraline 50 mg for depression & Trazodone 50 mg for insomnia. She presented no other pre-existing medical problems that required treatment or monitoring. She tolerated her treatment regimen without any adverse effects or reactions reported.  During the course of  her treatment, [name]'s progress was monitored by observation & her daily report of symptom reduction noted. Her emotional & mental status were assessed & monitored by daily self-inventory reports completed by her & the clinical staff. She was also evaluated daily by the treatment team for mood stability and plans for continued recovery after discharge. Carmen Gibbs's motivation was an integral factor in her mood stability. She was offered further  treatment options upon discharge on outpatient basis as listed below. She was provided with all the necessary information needed to make this appointment without problems. Upon discharge, she was both mentally & medically stable. She is adamntly denying suicidal/homicidal ideation, auditory/visual/tactile hallucinations, delusional thoughts & or paranoia. [facility] in no apparent distress. Transportation per wife.  Physical Findings: AIMS: Facial and Oral Movements Muscles of Facial Expression: None, normal Lips and Perioral Area: None, normal Jaw: None, normal Tongue: None, normal,Extremity Movements Upper (arms, wrists, hands, fingers): None, normal Lower (legs, knees, ankles, toes): None, normal, Trunk Movements Neck, shoulders, hips: None, normal, Overall Severity Severity of abnormal movements (highest score from questions above): None, normal Incapacitation due to abnormal movements: None, normal Patient's awareness of abnormal movements (rate only patient's report): No Awareness, Dental Status Current problems with teeth and/or dentures?: No Does patient usually wear dentures?: No  CIWA:    COWS:     Musculoskeletal: Strength & Muscle Tone: within normal limits Gait & Station: normal Patient leans: N/A  Psychiatric Specialty Exam: Physical Exam  Constitutional: She appears well-developed.  HENT:  Head: Normocephalic.  Eyes: Pupils are equal, round, and reactive to light.  Neck: Normal range of motion.  Cardiovascular: Normal rate.   Respiratory: Effort normal.  GI: Soft.  Genitourinary:  Denies any issues in this area  Musculoskeletal: Normal range of motion.  Neurological: She is alert.  Skin: Skin is warm.    Review of Systems  Constitutional: Negative.   HENT: Negative.   Eyes: Negative.   Respiratory: Negative.   Cardiovascular: Negative.   Gastrointestinal: Negative.   Genitourinary: Negative.   Musculoskeletal: Negative.    Skin: Negative.   Neurological: Negative.   Endo/Heme/Allergies: Negative.   Psychiatric/Behavioral: Positive for depression (Stable). Negative for suicidal ideas, hallucinations, memory loss and substance abuse. The patient has insomnia (Stable). The patient is not nervous/anxious.     Blood pressure 131/85, pulse 81, temperature 98.2 F (36.8 C), temperature source Oral, resp. rate 16, height 5\' 11"  (1.803 m), weight 80.287 kg (177 lb), last menstrual period 12/27/2015, SpO2 100 %.Body mass index is 24.7 kg/(m^2).  See Md's SRA   Have you used any form of tobacco in the last 30 days? (Cigarettes, Smokeless Tobacco, Cigars, and/or Pipes): Yes  Has this patient used any form of tobacco in the last 30 days? (Cigarettes, Smokeless Tobacco, Cigars, and/or Pipes): No  Blood Alcohol level:  Lab Results  Component Value Date   ETH <5 01/03/2016   Metabolic Disorder Labs:  No results found for: HGBA1C, MPG No results found for: PROLACTIN No results found for: CHOL, TRIG, HDL, CHOLHDL, VLDL, LDLCALC  See Psychiatric Specialty Exam and Suicide Risk Assessment completed by Attending Physician prior to discharge.  Discharge destination:  Home  Is patient on multiple antipsychotic therapies at discharge:  No   Has Patient had three or more failed trials of antipsychotic monotherapy by history:  No  Recommended Plan for Multiple Antipsychotic Therapies: NA    Medication List    STOP taking these medications  cetirizine 10 MG tablet  Commonly known as:  ZYRTEC     medroxyPROGESTERone 150 MG/ML injection  Commonly known as:  DEPO-PROVERA      TAKE these medications      Indication   acetaminophen 500 MG tablet  Commonly known as:  TYLENOL  Take 2 tablets (1,000 mg total) by mouth every 6 (six) hours as needed for mild pain.   Indication:  Fever, Pain     QUEtiapine 25 MG tablet  Commonly known as:  SEROQUEL  Take 1 tablet (25 mg total) by mouth 2 (two) times daily. For  agitation/mood control   Indication:  Agitation/mood control     sertraline 50 MG tablet  Commonly known as:  ZOLOFT  Take 3 tablets (150 mg total) by mouth daily. For depression   Indication:  Major Depressive Disorder     traZODone 50 MG tablet  Commonly known as:  DESYREL  Take 1 tablet (50 mg total) by mouth at bedtime. For sleep   Indication:  Trouble Sleeping       Follow-up Information    Follow up with [facility].   Why:  Medication management appt on Thursday July 20th at [facility] with Crystal. Please call office if you need to reschedule and bring insurance card with you to your appointment   Contact information:   [address] [place] [address] [phone]      Follow up with [facility].   Why:  Social worker will call you withing 1-2 business days of discharge with therapy appointment information.    Contact information:   [address] [place], [place] [address]  [phone]    Follow-up recommendations: Activity:  As tolerated Diet: As recommended by your primary care doctor. Keep all scheduled follow-up appointments as recommended.   Comments: Patient is instructed prior to discharge to: Take all medications as prescribed by his/her mental healthcare provider. Report any adverse effects and or reactions from the medicines to his/her outpatient provider promptly. Patient has been instructed & cautioned: To not engage in alcohol and or illegal drug use while on prescription medicines. In the event of worsening symptoms, patient is instructed to call the crisis hotline, 911 and or go to the nearest ED for appropriate evaluation and treatment of symptoms. To follow-up with his/her primary care provider for your other medical issues, concerns and or health care needs.   Signed: Sanjuana Kava, NP, PMHNP, FNP-Bc 01/09/2016, 9:20 AM

## 2016-01-07 NOTE — [facility] Suicide Risk Assessment (Signed)
[facility] INPATIENT:  Family/Significant Other Suicide Prevention Education  Suicide Prevention Education:  Education Completed; wife Claudell KyleKelly Friedli [phone],  (name of family member/significant other) has been identified by the patient as the family member/significant other with whom the patient will be residing, and identified as the person(s) who will aid the patient in the event of a mental health crisis (suicidal ideations/suicide attempt).  With written consent from the patient, the family member/significant other has been provided the following suicide prevention education, prior to the and/or following the discharge of the patient.  The suicide prevention education provided includes the following:  Suicide risk factors  Suicide prevention and interventions  National Suicide Hotline telephone number  [facility] assessment telephone number  [facility] Emergency Assistance 911  [facility] and/or Residential Mobile Crisis Unit telephone number  Request made of family/significant other to:  Remove weapons (e.g., guns, rifles, knives), all items previously/currently identified as safety concern.    Remove drugs/medications (over-the-counter, prescriptions, illicit drugs), all items previously/currently identified as a safety concern.  The family member/significant other verbalizes understanding of the suicide prevention education information provided.  The family member/significant other agrees to remove the items of safety concern listed above.  Smt. Loder, [place]ristin L 01/07/2016, 11:21 AM

## 2016-01-07 NOTE — Progress Notes (Signed)
Adult Psychoeducational Group Note  Date:  01/07/2016 Time:  10:21 AM  Group Topic/Focus:  Wellness Toolbox:   The focus of this group is to discuss various aspects of wellness, balancing those aspects and exploring ways to increase the ability to experience wellness.  Patients will create a wellness toolbox for use upon discharge.  Participation Level:  Active  Participation Quality:  Appropriate  Affect:  Appropriate  Cognitive:  Alert  Insight: Good  Engagement in Group:  Developing/Improving  Modes of Intervention:  Orientation  Additional Comments:   Pt participated appropriately  Carmen BeersRodney S Gennell Gibbs 01/07/2016, 10:21 AM

## 2016-01-07 NOTE — [facility] Group Notes (Signed)
   [facility] Dis[facility] Note  01/07/2016  8:45 AM   Participation Quality: Alert, Appropriate and Oriented  Mood/Affect: Appropriate  Depression Rating: reports little to no depression today  Anxiety Rating: reports experiencing mild anxiety related to wanting to discharge today  Thoughts of Suicide: Pt denies SI/HI  Will you contract for safety? Yes  Current AVH: Pt denies  Plan for Discharge/Comments: Pt attended discharge planning group and actively participated in group. CSW provided pt with today's workbook. Patient plans to return home to follow up with outpatient services.   Transportation Means: Pt reports access to transportation  Supports: No supports mentioned at this time  Samuella BruinKristin Velisa Regnier, MSW, Johnson [employer] JohnsonLCSW Clinical Social Worker [facility] [phone]

## 2016-01-07 NOTE — Tx Team (Signed)
Interdisciplinary Treatment Plan Update (Adult) Date: 01/07/2016    Time Reviewed: 9:30 AM  Progress in Treatment: Attending groups: Yes Participating in groups: Yes Taking medication as prescribed: Yes Tolerating medication: Yes Family/Significant other contact made: No, CSW assessing for appropriate contacts Patient understands diagnosis: Yes Discussing patient identified problems/goals with staff: Yes Medical problems stabilized or resolved: Yes Denies suicidal/homicidal ideation: Yes Issues/concerns per patient self-inventory: Yes Other:  New problem(s) identified: N/A  Discharge Plan or Barriers: Patient plans to return home to follow up with outpatient services  Reason for Continuation of Hospitalization:  Depression Anxiety Medication Stabilization   Comments: N/A  Estimated length of stay: Discharge anticipated for today 01/07/16    Patient is a 30 year old female who presented to the hospital with SI and recent diagnosis of Bipolar Disorder. Pt reports primary trigger(s) for admission was separation with wife. Patient will benefit from crisis stabilization, medication evaluation, group therapy and psycho education in addition to case management for discharge planning. At discharge, it is recommended that Pt remain compliant with established discharge plan and continued treatment.   Review of initial/current patient goals per problem list:  1. Goal(s): Patient will participate in aftercare plan   Met: Yes   Target date: 3-5 days post admission date   As evidenced by: Patient will participate within aftercare plan AEB aftercare provider and housing plan at discharge being identified.  6/23: Goal not met: CSW assessing for appropriate referrals for pt and will have follow up secured prior to d/c.  6/26: Goal met. Patient plans to return home to follow up with outpatient services.    2. Goal (s): Patient will exhibit decreased depressive symptoms and suicidal  ideations.   Met: Yes   Target date: 3-5 days post admission date   As evidenced by: Patient will utilize self rating of depression at 3 or below and demonstrate decreased signs of depression or be deemed stable for discharge by MD.  6/23: Goal not met: Pt presents with flat affect and depressed mood.  Pt admitted with depression rating of 10.  Pt to show decreased sign of depression and a rating of 3 or less before d/c.    6/26: Goal met. Patient reports little to no depression today, denies SI.    Attendees: Patient:    Family:    Physician: Dr. Shea Evans 01/07/2016 9:30 AM  Nursing: Marcella Dubs, Mayra Neer, Eulogio Bear, Hillery Aldo, RN 01/07/2016 9:30 AM  Clinical Social Worker: Tilden Fossa, LCSW 01/07/2016 9:30 AM  Other: Peri Maris, LCSWA; [place], LCSW  01/07/2016 9:30 AM  Other:  01/07/2016 9:30 AM  Other:  01/07/2016 9:30 AM  Other: June Leap, NP 01/07/2016 9:30 AM  Other:    Other:         Scribe for Treatment Team:  Tilden Fossa, [place]

## 2016-01-07 NOTE — [facility] Suicide Risk Assessment (Signed)
[facility]   Principal Problem: Bipolar 1 disorder, depressed, severe (HCC) Discharge Diagnoses:  Patient Active Problem List   Diagnosis Date Noted  . Bipolar 1 disorder, depressed, severe (HCC) [F31.4] 01/03/2016  . Positive serology for Helicobacter pylori [B96.81] 08/08/2012  . BMI 28.0-28.9,adult [Z68.28] 08/01/2012  . Scoliosis [M41.9] 08/01/2012    Total Time spent with patient: 30 minutes  Musculoskeletal: Strength & Muscle Tone: within normal limits Gait & Station: normal Patient leans: N/A  Psychiatric Specialty Exam: Review of Systems  Psychiatric/Behavioral: Negative for depression.  All other systems reviewed and are negative.   Blood pressure 131/85, pulse 81, temperature 98.2 F (36.8 C), temperature source Oral, resp. rate 16, height 5\' 11"  (1.803 m), weight 80.287 kg (177 lb), last menstrual period 12/27/2015, SpO2 100 %.Body mass index is 24.7 kg/(m^2).  General Appearance: Casual  Eye Contact::  Fair  Speech:  Normal Rate409  Volume:  Normal  Mood:  Euthymic  Affect:  Congruent  Thought Process:  Goal Directed and Descriptions of Associations: Intact  Orientation:  Full (Time, Place, and Person)  Thought Content:  Logical  Suicidal Thoughts:  No  Homicidal Thoughts:  No  Memory:  Immediate;   Fair Recent;   Fair Remote;   Fair  Judgement:  Fair  Insight:  Fair  Psychomotor Activity:  Normal  Concentration:  Fair  Recall:  [employer]Fair  Fund of Knowledge:Fair  Language: Fair  Akathisia:  No  Handed:  Right  AIMS (if indicated):     Assets:  Communication Skills Desire for Improvement  Sleep:  Number of Hours: 6.5  Cognition: WNL  ADL's:  Intact   Mental Status Per Nursing Assessment::   On Admission:  Suicidal ideation indicated by patient  Demographic Factors:  Caucasian  Loss Factors: NA  Historical Factors: Impulsivity  Risk Reduction Factors:   Positive social support  Continued Clinical Symptoms:   Previous Psychiatric Diagnoses and Treatments  Cognitive Features That Contribute To Risk:  None    Suicide Risk:  Minimal: No identifiable suicidal ideation.  Patients presenting with no risk factors but with morbid ruminations; may be classified as minimal risk based on the severity of the depressive symptoms  Follow-up Information    Follow up with [facility].   Why:  CSW made referral for services on 6/23. CSW will call you with appointment dates and times.   Contact information:   [address] [place] [address] [phone]      Plan Of Care/Follow-up recommendations:  Activity:  no restrictions Diet:  regular Tests:  as needed Other:  follow up with aftercare  Caela Huot, MD 01/07/2016, 10:42 AM

## 2016-01-07 NOTE — Progress Notes (Signed)
  [facility] Discharge Plan :  Will you be returning to the same living situation after discharge:  Yes,  patient plans to return home At discharge, do you have transportation home?: Yes,  wife Do you have the ability to pay for your medications: Yes,  patient will be provided with prescriptions   Release of information consent forms completed and in the chart;  Patient's signature needed at discharge.  Patient to Follow up at: Follow-up Information    Follow up with [facility].   Why:  Medication management appt on Thursday July 20th at [facility] with Crystal. Please call office if you need to reschedule and bring insurance card with you to your appointment   Contact information:   [address] [place] [address] [phone]      Follow up with [facility].   Why:  Social worker will call you withing 1-2 business days of discharge with therapy appointment information.    Contact information:   [address],  [place], [place] [address]  [phone]      Next level of care provider has access to [facility] Link:no  Safety Planning and Suicide Prevention discussed: Yes,  with patient and wife  Have you used any form of tobacco in the last 30 days? (Cigarettes, Smokeless Tobacco, Cigars, and/or Pipes): Yes  Has patient been referred to the Quitline?: Patient refused referral  Patient has been referred for addiction treatment: Yes  Markayla Reichart, [place]in L 01/07/2016, 11:02 AM

## 2016-01-07 NOTE — Progress Notes (Signed)
Patient ID: Carmen Gibbs, female   DOB: [birth year], 30 y.o.   MRN: [id number]   Pt discharged to lobby. Pt was stable and appreciative at that time. All papers and prescriptions were given and valuables returned. Verbal understanding expressed. Denies SI/HI and A/VH. Pt given opportunity to express concerns and ask questions.

## 2016-01-07 NOTE — Progress Notes (Signed)
Recreation Therapy Notes  Date: 06.26.2017 Time: 9:30am Location: [address] Group Room   Group Topic: Stress Management  Goal Area(s) Addresses:  Patient will actively participate in stress management techniques presented during session.   Behavioral Response: Did not attend.   Marvin Grabill L Crissy Mccreadie, LRT/CTRS        Aayush Gelpi L 01/07/2016 10:20 AM 

## 2016-01-10 NOTE — Progress Notes (Signed)
CSW has left patient 2 voicemail messages informing her of [facility] appt [facility].   Madiline Saffran, LCSW Clinical Social Worker [facility] [phone]

## 2016-10-23 DIAGNOSIS — N912 Amenorrhea, unspecified: Secondary | ICD-10-CM | POA: Insufficient documentation

## 2017-10-20 LAB — OB RESULTS CONSOLE RUBELLA ANTIBODY, IGM: Rubella: IMMUNE

## 2017-10-20 LAB — OB RESULTS CONSOLE GC/CHLAMYDIA
Chlamydia: NEGATIVE
Gonorrhea: NEGATIVE

## 2017-10-20 LAB — OB RESULTS CONSOLE ANTIBODY SCREEN: Antibody Screen: NEGATIVE

## 2017-10-20 LAB — OB RESULTS CONSOLE HEPATITIS B SURFACE ANTIGEN: Hepatitis B Surface Ag: NEGATIVE

## 2017-10-20 LAB — OB RESULTS CONSOLE HIV ANTIBODY (ROUTINE TESTING): HIV: NONREACTIVE

## 2017-10-20 LAB — OB RESULTS CONSOLE ABO/RH: RH Type: NEGATIVE

## 2017-10-20 LAB — OB RESULTS CONSOLE RPR: RPR: NONREACTIVE

## 2018-02-06 ENCOUNTER — Other Ambulatory Visit: Payer: Self-pay

## 2018-02-06 ENCOUNTER — Encounter (HOSPITAL_COMMUNITY): Payer: Self-pay

## 2018-02-06 ENCOUNTER — Inpatient Hospital Stay (HOSPITAL_COMMUNITY)
Admission: AD | Admit: 2018-02-06 | Discharge: 2018-02-06 | Disposition: A | Discharge status: Home / Self Care | Payer: 59 | Source: Ambulatory Visit | Attending: Obstetrics and Gynecology | Admitting: Obstetrics and Gynecology

## 2018-02-06 ENCOUNTER — Inpatient Hospital Stay (HOSPITAL_BASED_OUTPATIENT_CLINIC_OR_DEPARTMENT_OTHER): Payer: 59

## 2018-02-06 DIAGNOSIS — Z3A23 23 weeks gestation of pregnancy: Secondary | ICD-10-CM

## 2018-02-06 DIAGNOSIS — O26899 Other specified pregnancy related conditions, unspecified trimester: Secondary | ICD-10-CM

## 2018-02-06 DIAGNOSIS — O99332 Smoking (tobacco) complicating pregnancy, second trimester: Secondary | ICD-10-CM | POA: Insufficient documentation

## 2018-02-06 DIAGNOSIS — O2342 Unspecified infection of urinary tract in pregnancy, second trimester: Secondary | ICD-10-CM | POA: Diagnosis not present

## 2018-02-06 DIAGNOSIS — O26892 Other specified pregnancy related conditions, second trimester: Secondary | ICD-10-CM

## 2018-02-06 DIAGNOSIS — R109 Unspecified abdominal pain: Secondary | ICD-10-CM

## 2018-02-06 DIAGNOSIS — R102 Pelvic and perineal pain: Secondary | ICD-10-CM

## 2018-02-06 DIAGNOSIS — F17210 Nicotine dependence, cigarettes, uncomplicated: Secondary | ICD-10-CM | POA: Insufficient documentation

## 2018-02-06 HISTORY — DX: Mental disorder, not otherwise specified: F99

## 2018-02-06 LAB — URINALYSIS, ROUTINE W REFLEX MICROSCOPIC
Bilirubin Urine: NEGATIVE
Glucose, UA: NEGATIVE mg/dL
Hgb urine dipstick: NEGATIVE
Ketones, ur: NEGATIVE mg/dL
Nitrite: NEGATIVE
Protein, ur: NEGATIVE mg/dL
Specific Gravity, Urine: 1.011 (ref 1.005–1.030)
pH: 6 (ref 5.0–8.0)

## 2018-02-06 LAB — CBC
HCT: 33.8 % — ABNORMAL LOW (ref 36.0–46.0)
Hemoglobin: 11.5 g/dL — ABNORMAL LOW (ref 12.0–15.0)
MCH: 31.3 pg (ref 26.0–34.0)
MCHC: 34 g/dL (ref 30.0–36.0)
MCV: 92.1 fL (ref 78.0–100.0)
Platelets: 200 10*3/uL (ref 150–400)
RBC: 3.67 MIL/uL — ABNORMAL LOW (ref 3.87–5.11)
RDW: 14 % (ref 11.5–15.5)
WBC: 10.6 10*3/uL — ABNORMAL HIGH (ref 4.0–10.5)

## 2018-02-06 LAB — WET PREP, GENITAL
Clue Cells Wet Prep HPF POC: NONE SEEN
Sperm: NONE SEEN
Trich, Wet Prep: NONE SEEN
Yeast Wet Prep HPF POC: NONE SEEN

## 2018-02-06 MED ORDER — SULFAMETHOXAZOLE-TRIMETHOPRIM 800-160 MG PO TABS: 1.0000 | ORAL_TABLET | Freq: Two times a day (BID) | ORAL | 0 refills | Status: AC

## 2018-02-06 NOTE — MAU Note (Signed)
Pt. States lower right sided abd. Pain radiating to back started about 1.5 hrs ago.  Pt reports no pain when sitting still, but states pain is 6/10 when she moves around. Pt. Denies vag bleeding but reports some watery dc.

## 2018-02-06 NOTE — MAU Note (Signed)
Urine in lab 

## 2018-02-06 NOTE — MAU Provider Note (Addendum)
Chief Complaint:  Abdominal Pain and Back Pain   First Provider Initiated Contact with Patient 02/06/18 1858      HPI: Carmen Gibbs is a 32 y.o. G1P0 at 37w4dwho presents to maternity admissions reporting RLQ sharp intermittent pain. The pain occurs only when she moves or changes positions.  At rest she does not have any pain. She noticed the pain first when standing up from a chair today. The pain radiates through to her back, it does not wrap around her right side/flank but goes through to her low back. She denies dysuria but reports that despite increase PO intake, her urine output has been less today. She reports discharge throughout the pregnancy that is thin but recently has been more watery. There are no other associated symptoms. She has not tried any treatments. She reports good fetal movement, denies vaginal bleeding, vaginal itching/burning, h/a, dizziness, n/v, or fever/chills.    HPI  Past Medical History: Past Medical History:  Diagnosis Date  . Allergy   . Mental disorder    bipolar   . Scoliosis     Past obstetric history: OB History  Gravida Para Term Preterm AB Living  1            SAB TAB Ectopic Multiple Live Births               # Outcome Date GA Lbr Len/2nd Weight Sex Delivery Anes PTL Lv  1 Current             Past Surgical History: Past Surgical History:  Procedure Laterality Date  . CHOLECYSTECTOMY    . WISDOM TOOTH EXTRACTION      Family History: Family History  Problem Relation Age of Onset  . Diabetes Mother   . Hypertension Father     Social History: Social History   Tobacco Use  . Smoking status: Current Some Day Smoker    Packs/day: 0.50    Types: Cigarettes  . Smokeless tobacco: Never Used  Substance Use Topics  . Alcohol use: Not Currently    Alcohol/week: 2.4 oz    Types: 2 Glasses of wine, 2 Cans of beer per week  . Drug use: No    Allergies:  Allergies  Allergen Reactions  . Ciprofloxacin Hives  . Nicotine Other  (See Comments)    Erythematic rash caused by Nicotine Patch  . Latex Swelling and Rash    Meds:  Medications Prior to Admission  Medication Sig Dispense Refill Last Dose  . acetaminophen (TYLENOL) 500 MG tablet Take 2 tablets (1,000 mg total) by mouth every 6 (six) hours as needed for mild pain. 1 tablet 0 02/06/2018 at Unknown time  . clonazePAM (KLONOPIN) 1 MG tablet Take 1 mg by mouth 2 (two) times daily as needed for anxiety.     Marland Kitchen QUEtiapine (SEROQUEL) 25 MG tablet Take 1 tablet (25 mg total) by mouth 2 (two) times daily. For agitation/mood control 60 tablet 0 Unknown at Unknown time  . sertraline (ZOLOFT) 50 MG tablet Take 3 tablets (150 mg total) by mouth daily. For depression 150 tablet 0 Unknown at Unknown time  . traZODone (DESYREL) 50 MG tablet Take 1 tablet (50 mg total) by mouth at bedtime. For sleep 30 tablet 0 Unknown at Unknown time    ROS:  Review of Systems  Constitutional: Negative for chills, fatigue and fever.  Eyes: Negative for visual disturbance.  Respiratory: Negative for shortness of breath.   Cardiovascular: Negative for chest pain.  Gastrointestinal: Positive  for abdominal pain. Negative for nausea and vomiting.  Genitourinary: Positive for vaginal discharge. Negative for difficulty urinating, dysuria, flank pain, pelvic pain, vaginal bleeding and vaginal pain.  Musculoskeletal: Positive for back pain.  Neurological: Negative for dizziness and headaches.  Psychiatric/Behavioral: Negative.      I have reviewed patient's Past Medical Hx, Surgical Hx, Family Hx, Social Hx, medications and allergies.   Physical Exam   Patient Vitals for the past 24 hrs:  BP Temp Temp src Pulse Resp SpO2 Weight  02/06/18 1840 - - - - - 96 % -  02/06/18 1805 125/61 98.6 F (37 C) Oral 70 18 97 % -  02/06/18 1758 - - - - - - 200 lb (90.7 kg)   Constitutional: Well-developed, well-nourished female in no acute distress.  Cardiovascular: normal rate Respiratory: normal  effort GI: Abd soft, non-tender, gravid appropriate for gestational age.  MS: Extremities nontender, no edema, normal ROM Neurologic: Alert and oriented x 4.  GU: Neg CVAT.  PELVIC EXAM: Cervix pink, visually closed, without lesion, moderate thick white discharge, vaginal walls and external genitalia normal   Dilation: Closed Effacement (%): 50 Cervical Position: Posterior Station: Ballotable Exam by:: Sharen Counter CNM   FHT:  Baseline 140 , moderate variability, accelerations present, no decelerations Contractions: None on toco or to palpation   Labs: Results for orders placed or performed during the hospital encounter of 02/06/18 (from the past 24 hour(s))  Urinalysis, Routine w reflex microscopic     Status: Abnormal   Collection Time: 02/06/18  5:53 PM  Result Value Ref Range   Color, Urine YELLOW YELLOW   APPearance HAZY (A) CLEAR   Specific Gravity, Urine 1.011 1.005 - 1.030   pH 6.0 5.0 - 8.0   Glucose, UA NEGATIVE NEGATIVE mg/dL   Hgb urine dipstick NEGATIVE NEGATIVE   Bilirubin Urine NEGATIVE NEGATIVE   Ketones, ur NEGATIVE NEGATIVE mg/dL   Protein, ur NEGATIVE NEGATIVE mg/dL   Nitrite NEGATIVE NEGATIVE   Leukocytes, UA MODERATE (A) NEGATIVE   WBC, UA 11-20 0 - 5 WBC/hpf   Bacteria, UA MANY (A) NONE SEEN   Squamous Epithelial / LPF 11-20 0 - 5   Mucus PRESENT   Wet prep, genital     Status: Abnormal   Collection Time: 02/06/18  7:00 PM  Result Value Ref Range   Yeast Wet Prep HPF POC NONE SEEN NONE SEEN   Trich, Wet Prep NONE SEEN NONE SEEN   Clue Cells Wet Prep HPF POC NONE SEEN NONE SEEN   WBC, Wet Prep HPF POC MANY (A) NONE SEEN   Sperm NONE SEEN   CBC     Status: Abnormal   Collection Time: 02/06/18  7:45 PM  Result Value Ref Range   WBC 10.6 (H) 4.0 - 10.5 K/uL   RBC 3.67 (L) 3.87 - 5.11 MIL/uL   Hemoglobin 11.5 (L) 12.0 - 15.0 g/dL   HCT 40.9 (L) 81.1 - 91.4 %   MCV 92.1 78.0 - 100.0 fL   MCH 31.3 26.0 - 34.0 pg   MCHC 34.0 30.0 - 36.0 g/dL    RDW 78.2 95.6 - 21.3 %   Platelets 200 150 - 400 K/uL      Imaging:  No results found.  MAU Course/MDM: UA with moderate leukocytes, many bacteria, nitrite negative NST reviewed and reactive  No rebound tenderness or guarding CBC pending Cervix subjectively short on exam but technically difficult with very posterior position so [place] ordered for cervical length and to  evaluate placenta  US with cervical length 3.4 cm so wnl, no evidence of preterm labor or shortened cervix UA with some indication of UTI, pt pain musculoskeletal in nature Consult Dr Rana SnareLowe with assessment and findings Bactrim DS BID x 7 days, rest/ice/heat/warm bath/Tylenol/pregnancy support belt for pain F/U in office Return to MAU for emergencies Pt discharge with strict preterm labor precautions.  Today's evaluation included a work-up for preterm labor which can be life-threatening for both mom and baby.  Assessment: 1. UTI (urinary tract infection) during pregnancy, second trimester   2. Abdominal pain during pregnancy in second trimester   3. Pain of round ligament affecting pregnancy, antepartum     Plan: Discharge home Labor precautions and fetal kick counts Follow-up Information    [place] Follow up.   Why:  As scheduled, return to MAU as needed for emergencies. Contact information: [address] [place] [address] [phone]          Allergies as of 02/06/2018      Reactions   Ciprofloxacin Hives   Nicotine Other (See Comments)   Erythematic rash caused by Nicotine Patch   Latex Swelling, Rash      Medication List    TAKE these medications   acetaminophen 500 MG tablet Commonly known as:  TYLENOL Take 2 tablets (1,000 mg total) by mouth every 6 (six) hours as needed for mild pain.   clonazePAM 1 MG tablet Commonly known as:  KLONOPIN Take 1 mg by mouth 2 (two) times daily as needed for anxiety.   QUEtiapine 25 MG tablet Commonly  known as:  SEROQUEL Take 1 tablet (25 mg total) by mouth 2 (two) times daily. For agitation/mood control   sertraline 50 MG tablet Commonly known as:  ZOLOFT Take 3 tablets (150 mg total) by mouth daily. For depression   sulfamethoxazole-trimethoprim 800-160 MG tablet Commonly known as:  BACTRIM DS,SEPTRA DS Take 1 tablet by mouth 2 (two) times daily for 7 days.   traZODone 50 MG tablet Commonly known as:  DESYREL Take 1 tablet (50 mg total) by mouth at bedtime. For sleep       Sharen CounterLisa Leftwich-Kirby Certified Nurse-Midwife 02/06/2018 8:07 PM

## 2018-02-08 LAB — CULTURE, OB URINE: Culture: <10000 — AB

## 2018-03-11 ENCOUNTER — Inpatient Hospital Stay (HOSPITAL_COMMUNITY)
Admission: AD | Admit: 2018-03-11 | Discharge: 2018-03-11 | Disposition: A | Discharge status: Home / Self Care | Payer: 59 | Source: Ambulatory Visit | Attending: Obstetrics & Gynecology | Admitting: Obstetrics & Gynecology

## 2018-03-11 ENCOUNTER — Encounter (HOSPITAL_COMMUNITY): Payer: Self-pay | Admitting: *Deleted

## 2018-03-11 DIAGNOSIS — F17210 Nicotine dependence, cigarettes, uncomplicated: Secondary | ICD-10-CM | POA: Insufficient documentation

## 2018-03-11 DIAGNOSIS — Z881 Allergy status to other antibiotic agents status: Secondary | ICD-10-CM | POA: Insufficient documentation

## 2018-03-11 DIAGNOSIS — Z3689 Encounter for other specified antenatal screening: Secondary | ICD-10-CM

## 2018-03-11 DIAGNOSIS — R1031 Right lower quadrant pain: Secondary | ICD-10-CM | POA: Insufficient documentation

## 2018-03-11 DIAGNOSIS — Z3A28 28 weeks gestation of pregnancy: Secondary | ICD-10-CM | POA: Diagnosis not present

## 2018-03-11 DIAGNOSIS — O99333 Smoking (tobacco) complicating pregnancy, third trimester: Secondary | ICD-10-CM | POA: Diagnosis not present

## 2018-03-11 DIAGNOSIS — O2342 Unspecified infection of urinary tract in pregnancy, second trimester: Secondary | ICD-10-CM

## 2018-03-11 DIAGNOSIS — O2343 Unspecified infection of urinary tract in pregnancy, third trimester: Secondary | ICD-10-CM | POA: Insufficient documentation

## 2018-03-11 DIAGNOSIS — R102 Pelvic and perineal pain: Secondary | ICD-10-CM | POA: Insufficient documentation

## 2018-03-11 DIAGNOSIS — O9989 Other specified diseases and conditions complicating pregnancy, childbirth and the puerperium: Secondary | ICD-10-CM | POA: Diagnosis not present

## 2018-03-11 DIAGNOSIS — O26899 Other specified pregnancy related conditions, unspecified trimester: Secondary | ICD-10-CM

## 2018-03-11 DIAGNOSIS — M419 Scoliosis, unspecified: Secondary | ICD-10-CM | POA: Diagnosis not present

## 2018-03-11 DIAGNOSIS — O26893 Other specified pregnancy related conditions, third trimester: Secondary | ICD-10-CM | POA: Insufficient documentation

## 2018-03-11 LAB — URINALYSIS, ROUTINE W REFLEX MICROSCOPIC
Bilirubin Urine: NEGATIVE
Glucose, UA: 50 mg/dL — AB
Hgb urine dipstick: NEGATIVE
Ketones, ur: NEGATIVE mg/dL
Nitrite: NEGATIVE
Protein, ur: NEGATIVE mg/dL
Specific Gravity, Urine: 1.018 (ref 1.005–1.030)
WBC, UA: >50 WBC/hpf — ABNORMAL HIGH (ref 0–5)
pH: 5 (ref 5.0–8.0)

## 2018-03-11 LAB — CBC
HCT: 35.7 % — ABNORMAL LOW (ref 36.0–46.0)
Hemoglobin: 12 g/dL (ref 12.0–15.0)
MCH: 30.7 pg (ref 26.0–34.0)
MCHC: 33.6 g/dL (ref 30.0–36.0)
MCV: 91.3 fL (ref 78.0–100.0)
Platelets: 238 10*3/uL (ref 150–400)
RBC: 3.91 MIL/uL (ref 3.87–5.11)
RDW: 14 % (ref 11.5–15.5)
WBC: 10.5 10*3/uL (ref 4.0–10.5)

## 2018-03-11 NOTE — MAU Note (Signed)
Pt reports she was treated for UTI about one and a half months ago, reports she was put on amoxicillin again on Monday for uti, but symptoms seem to be worsening.

## 2018-03-11 NOTE — Discharge Instructions (Signed)

## 2018-03-11 NOTE — MAU Provider Note (Addendum)
History     CSN: [id number]  Arrival date and time: 03/11/18 1417   First Provider Initiated Contact with Patient 03/11/18 1457      Chief Complaint  Patient presents with  . Abdominal Pain   G1 @28 .2 wks here with RLQ pain. Pain has been ongoing for weeks but worsened and lasted longer today. Pain is intermittent and sharp, worse with position changes and movement. Rates 8/10. Has not tried anything for it. Denies urinary sx. No recent injury, fall, lifting, or pushing/pulling. Reports good FM. No VB, LOF, or ctx. She is being treated for a presumed UTI since 3 days ago.    OB History    Gravida  1   Para      Term      Preterm      AB      Living        SAB      TAB      Ectopic      Multiple      Live Births              Past Medical History:  Diagnosis Date  . Allergy   . Mental disorder    bipolar   . Scoliosis     Past Surgical History:  Procedure Laterality Date  . CHOLECYSTECTOMY    . WISDOM TOOTH EXTRACTION      Family History  Problem Relation Age of Onset  . Diabetes Mother   . Hypertension Father     Social History   Tobacco Use  . Smoking status: Current Some Day Smoker    Packs/day: 0.50    Types: Cigarettes  . Smokeless tobacco: Never Used  Substance Use Topics  . Alcohol use: Not Currently    Alcohol/week: 4.0 standard drinks    Types: 2 Glasses of wine, 2 Cans of beer per week  . Drug use: No    Allergies:  Allergies  Allergen Reactions  . Ciprofloxacin Hives  . Nicotine Other (See Comments)    Erythematic rash caused by Nicotine Patch  . Latex Swelling and Rash    Medications Prior to Admission  Medication Sig Dispense Refill Last Dose  . amoxicillin (AMOXIL) 875 MG tablet Take 875 mg by mouth every 12 (twelve) hours.  0 03/11/2018 at Unknown time  . CALCIUM PO Take 1 tablet by mouth at bedtime.    03/10/2018 at Unknown time  . levocetirizine (XYZAL) 5 MG tablet Take 5 mg by mouth every evening.   03/10/2018  at Unknown time  . Melatonin-Pyridoxine (MELATIN PO) Take 1 tablet by mouth at bedtime.    03/10/2018 at Unknown time  . Prenatal Vit-Fe Fumarate-FA (PRENATAL MULTIVITAMIN) TABS tablet Take 1 tablet by mouth daily at 12 noon.   03/10/2018 at Unknown time    Review of Systems  Constitutional: Negative for chills and fever.  Gastrointestinal: Positive for abdominal pain. Negative for nausea and vomiting.  Genitourinary: Negative for dysuria, frequency, hematuria, vaginal bleeding and vaginal discharge.  Musculoskeletal: Negative for back pain.   Physical Exam   Blood pressure 120/69, pulse 93, temperature 98.2 F (36.8 C), temperature source Oral, resp. rate 17, height 5\' 11"  (1.803 m), weight 91.6 kg, last menstrual period 08/25/2017, SpO2 99 %.  Physical Exam  Nursing note and vitals reviewed. Constitutional: She is oriented to person, place, and time. She appears well-developed and well-nourished. No distress.  HENT:  Head: Normocephalic and atraumatic.  Neck: Normal range of motion.  Respiratory: Effort  normal. No respiratory distress.  GI: Soft. She exhibits no distension and no mass. There is tenderness in the right lower quadrant. There is no rebound, no guarding and no CVA tenderness.  gravid  Genitourinary:  Genitourinary Comments: VE: closed/thick  Musculoskeletal: Normal range of motion.  Neurological: She is alert and oriented to person, place, and time.  Skin: Skin is warm and dry.  Psychiatric: She has a normal mood and affect.  EFM: 135 bpm, mod variability, + accels, no decels Toco: none  Results for orders placed or performed during the hospital encounter of 03/11/18 (from the past 24 hour(s))  Urinalysis, Routine w reflex microscopic     Status: Abnormal   Collection Time: 03/11/18  2:57 PM  Result Value Ref Range   Color, Urine YELLOW YELLOW   APPearance CLOUDY (A) CLEAR   Specific Gravity, Urine 1.018 1.005 - 1.030   pH 5.0 5.0 - 8.0   Glucose, UA 50 (A)  NEGATIVE mg/dL   Hgb urine dipstick NEGATIVE NEGATIVE   Bilirubin Urine NEGATIVE NEGATIVE   Ketones, ur NEGATIVE NEGATIVE mg/dL   Protein, ur NEGATIVE NEGATIVE mg/dL   Nitrite NEGATIVE NEGATIVE   Leukocytes, UA LARGE (A) NEGATIVE   RBC / HPF 6-10 0 - 5 RBC/hpf   WBC, UA >50 (H) 0 - 5 WBC/hpf   Bacteria, UA FEW (A) NONE SEEN   Squamous Epithelial / LPF 21-50 0 - 5   Mucus PRESENT   CBC     Status: Abnormal   Collection Time: 03/11/18  3:36 PM  Result Value Ref Range   WBC 10.5 4.0 - 10.5 K/uL   RBC 3.91 3.87 - 5.11 MIL/uL   Hemoglobin 12.0 12.0 - 15.0 g/dL   HCT 16.1 (L) 09.6 - 04.5 %   MCV 91.3 78.0 - 100.0 fL   MCH 30.7 26.0 - 34.0 pg   MCHC 33.6 30.0 - 36.0 g/dL   RDW 40.9 81.1 - 91.4 %   Platelets 238 150 - 400 K/uL   MAU Course  Procedures  MDM Labs ordered and reviewed. No evidence of PTL or acute abdominal process. UA consistent with UTI. Pain likely RL, discussed comfort measures. Presentation, clinical findings, and plan discussed with Dr. Langston Masker. UC from office pending. Stable for discharge home.  Assessment and Plan   1. [redacted] weeks gestation of pregnancy   2. NST (non-stress test) reactive   3. Pain of round ligament during pregnancy   4. Urinary tract infection in mother during second trimester of pregnancy    Discharge home Follow up in OB office next week as scheduled PTL precautions  Allergies as of 03/11/2018      Reactions   Ciprofloxacin Hives   Nicotine Other (See Comments)   Erythematic rash caused by Nicotine Patch   Latex Swelling, Rash      Medication List    STOP taking these medications   MELATIN PO     TAKE these medications   amoxicillin 875 MG tablet Commonly known as:  AMOXIL Take 875 mg by mouth every 12 (twelve) hours.   CALCIUM PO Take 1 tablet by mouth at bedtime.   levocetirizine 5 MG tablet Commonly known as:  XYZAL Take 5 mg by mouth every evening.   prenatal multivitamin Tabs tablet Take 1 tablet by mouth daily at  12 noon.      Donette Larry, CNM 03/11/2018, 4:00 PM

## 2018-03-21 ENCOUNTER — Other Ambulatory Visit: Payer: Self-pay

## 2018-03-21 ENCOUNTER — Inpatient Hospital Stay (HOSPITAL_COMMUNITY)
Admission: AD | Admit: 2018-03-21 | Discharge: 2018-03-21 | Disposition: A | Discharge status: Home / Self Care | Payer: 59 | Source: Ambulatory Visit | Attending: Obstetrics and Gynecology | Admitting: Obstetrics and Gynecology

## 2018-03-21 ENCOUNTER — Encounter (HOSPITAL_COMMUNITY): Payer: Self-pay

## 2018-03-21 DIAGNOSIS — O26893 Other specified pregnancy related conditions, third trimester: Secondary | ICD-10-CM | POA: Diagnosis not present

## 2018-03-21 DIAGNOSIS — Z9049 Acquired absence of other specified parts of digestive tract: Secondary | ICD-10-CM | POA: Diagnosis not present

## 2018-03-21 DIAGNOSIS — Z79899 Other long term (current) drug therapy: Secondary | ICD-10-CM | POA: Diagnosis not present

## 2018-03-21 DIAGNOSIS — Z881 Allergy status to other antibiotic agents status: Secondary | ICD-10-CM | POA: Insufficient documentation

## 2018-03-21 DIAGNOSIS — R109 Unspecified abdominal pain: Secondary | ICD-10-CM | POA: Diagnosis present

## 2018-03-21 DIAGNOSIS — F17210 Nicotine dependence, cigarettes, uncomplicated: Secondary | ICD-10-CM | POA: Diagnosis not present

## 2018-03-21 DIAGNOSIS — Z3A29 29 weeks gestation of pregnancy: Secondary | ICD-10-CM | POA: Diagnosis not present

## 2018-03-21 DIAGNOSIS — M549 Dorsalgia, unspecified: Secondary | ICD-10-CM | POA: Insufficient documentation

## 2018-03-21 DIAGNOSIS — O26899 Other specified pregnancy related conditions, unspecified trimester: Secondary | ICD-10-CM

## 2018-03-21 LAB — WET PREP, GENITAL
Clue Cells Wet Prep HPF POC: NONE SEEN
Sperm: NONE SEEN
Trich, Wet Prep: NONE SEEN
Yeast Wet Prep HPF POC: NONE SEEN

## 2018-03-21 LAB — URINALYSIS, ROUTINE W REFLEX MICROSCOPIC
Bilirubin Urine: NEGATIVE
Glucose, UA: NEGATIVE mg/dL
Hgb urine dipstick: NEGATIVE
Ketones, ur: 5 mg/dL — AB
Nitrite: NEGATIVE
Protein, ur: NEGATIVE mg/dL
Specific Gravity, Urine: 1.02 (ref 1.005–1.030)
pH: 5 (ref 5.0–8.0)

## 2018-03-21 MED ORDER — CYCLOBENZAPRINE HCL 10 MG PO TABS
10.0000 mg | ORAL_TABLET | Freq: Once | ORAL | Status: AC
  Administered 2018-03-21: 10 mg via ORAL
  Filled 2018-03-21: qty 1

## 2018-03-21 NOTE — MAU Provider Note (Addendum)
Patient Carmen Gibbs is a 32 y.o.  G1P0 At [redacted]w[redacted]d here with complaints of on-going side pain . She denies nausea, vomiting, decreased fetal movements, VB, or LOF. She was recently diagnosed with GDM. She is upset because her urine culture was lost at her ob-gyns and she is very concerned that she still has a UTI.    History     CSN: [id number]  Arrival date and time: 03/21/18 1236   None     Chief Complaint  Patient presents with  . Abdominal Pain  . Back Pain   HPI Patient was seen on July 27 in MAU and was given Bactrim due to possible UTI . Patient states that the midwife told her that her bladder looked enlarged on [place]. (Actual urine culture showed small growth when the results came back.)   She felt a little bit better after taking her antibiotics in July, but then she started to have more pain and low urine output within a week. She called her ob-gyn and they gave her medicine  which she didn't take because she felt better, this was in the middle of August. In end of August, she went for an ob-gyn appointment for glucose test and was told that she had another bladder infection and she was started on Amoxocillin.  She does not know why they told her she had a bladder infection at the end of August.  She then started having pain again so she came back to MAU on August 29 with the same pain. She was then told on September 3 that her ob had lost her culture, so she left another sample. On Friday the 6th, she was told by her ob-gyn that the sample was negative.   The pain is sharp; it is located in lower right quandrant. It is a 6/10; she says that nothing makes it better or worse. She has tried repositioning for the pain and it is not better.   OB History    Gravida  1   Para      Term      Preterm      AB      Living        SAB      TAB      Ectopic      Multiple      Live Births              Past Medical History:  Diagnosis Date  . Allergy   . Mental  disorder    bipolar   . Scoliosis     Past Surgical History:  Procedure Laterality Date  . CHOLECYSTECTOMY    . WISDOM TOOTH EXTRACTION      Family History  Problem Relation Age of Onset  . Diabetes Mother   . Hypertension Father     Social History   Tobacco Use  . Smoking status: Current Some Day Smoker    Packs/day: 0.50    Types: Cigarettes  . Smokeless tobacco: Never Used  Substance Use Topics  . Alcohol use: Not Currently    Alcohol/week: 4.0 standard drinks    Types: 2 Glasses of wine, 2 Cans of beer per week  . Drug use: No    Allergies:  Allergies  Allergen Reactions  . Ciprofloxacin Hives  . Nicotine Other (See Comments)    Erythematic rash caused by Nicotine Patch  . Latex Swelling and Rash    Medications Prior to Admission  Medication Sig Dispense Refill  Last Dose  . amoxicillin (AMOXIL) 875 MG tablet Take 875 mg by mouth every 12 (twelve) hours.  0 Past Month at Unknown time  . CALCIUM PO Take 1 tablet by mouth at bedtime.    03/20/2018 at Unknown time  . levocetirizine (XYZAL) 5 MG tablet Take 5 mg by mouth every evening.   03/20/2018 at Unknown time  . Prenatal Vit-Fe Fumarate-FA (PRENATAL MULTIVITAMIN) TABS tablet Take 1 tablet by mouth daily at 12 noon.   03/20/2018 at Unknown time    Review of Systems  Constitutional: Negative.   HENT: Negative.   Respiratory: Negative.   Cardiovascular: Negative.   Gastrointestinal: Negative.   Genitourinary: Negative.   Musculoskeletal: Negative.   Neurological: Negative.   Hematological: Negative.   Psychiatric/Behavioral: Negative.    Physical Exam   Blood pressure 129/67, pulse 88, temperature 98.4 F (36.9 C), temperature source Oral, resp. rate 18, weight 94.7 kg, last menstrual period 08/25/2017.  Physical Exam  Constitutional: She appears well-developed.  HENT:  Head: Normocephalic.  Eyes: Pupils are equal, round, and reactive to light.  Neck: Normal range of motion.  Respiratory: Effort  normal.  GI: Soft. She exhibits no distension and no mass. There is no tenderness. There is no rebound and no guarding.  Genitourinary: No vaginal discharge found.  Musculoskeletal: Normal range of motion.  Neurological: She is alert.  Skin: Skin is warm and dry.  Psychiatric: She has a normal mood and affect.    MAU Course  Procedures  MDM -NST: 145 bpm, mod var, present acel, neg decels, no contractions -UA: no signs of infection but will send again for confirmation  -wet prep: large WBCS; GC chlamydia pending.   Declines vaginal exam; is not worried about labor or contractions.   Discussed with patient that her urine culture from July was negative, and given that her ob-gyn told her that her urine culture was negative, it was unlikely that she ever had a UTI. More than likely, she has MSK pain which can be managed with heat, tylenol, maternity belt.   Patient's pain not relieved by Flexeril; patient is calm-appearing and not-distressed while in MAU.   Assessment and Plan   1. Abdominal pain affecting pregnancy    2. Patient stable for discharge with recommendations to keep next ob appt.   3. Try maternity support belt, heat, tylenol.   4. Warning signs and when to return to MAU reviewed; patient verbalized understanding.   Charlesetta Garibaldi Kooistra 03/21/2018, 1:49 PM

## 2018-03-21 NOTE — MAU Note (Addendum)
Bladder infection since July, took antibiotics twice and they didn't work either time  Pain is ongoing, right sided lower abdominal pain and moves into back  No vaginal bleeding, no LOF, no vaginal discharge  +FM

## 2018-03-21 NOTE — Discharge Instructions (Signed)

## 2018-03-22 LAB — GC/CHLAMYDIA PROBE AMP (~~LOC~~) NOT AT ARMC
Chlamydia: NEGATIVE
Neisseria Gonorrhea: NEGATIVE

## 2018-05-05 ENCOUNTER — Telehealth (HOSPITAL_COMMUNITY): Payer: Self-pay | Admitting: *Deleted

## 2018-05-05 ENCOUNTER — Encounter (HOSPITAL_COMMUNITY): Payer: Self-pay | Admitting: *Deleted

## 2018-05-05 NOTE — Telephone Encounter (Signed)
Preadmission screen  

## 2018-05-13 ENCOUNTER — Observation Stay (HOSPITAL_COMMUNITY)
Admission: RE | Admit: 2018-05-13 | Discharge: 2018-05-13 | Disposition: A | Discharge status: Home / Self Care | Payer: 59 | Source: Ambulatory Visit | Attending: Obstetrics and Gynecology | Admitting: Obstetrics and Gynecology

## 2018-05-17 ENCOUNTER — Telehealth (HOSPITAL_COMMUNITY): Payer: Self-pay | Admitting: *Deleted

## 2018-05-17 ENCOUNTER — Inpatient Hospital Stay (HOSPITAL_COMMUNITY)
Admission: AD | Admit: 2018-05-17 | Discharge: 2018-05-17 | Disposition: A | Discharge status: Home / Self Care | Payer: 59 | Source: Ambulatory Visit | Attending: Obstetrics and Gynecology | Admitting: Obstetrics and Gynecology

## 2018-05-17 ENCOUNTER — Other Ambulatory Visit: Payer: Self-pay

## 2018-05-17 ENCOUNTER — Encounter (HOSPITAL_COMMUNITY): Payer: Self-pay | Admitting: *Deleted

## 2018-05-17 DIAGNOSIS — Z3A37 37 weeks gestation of pregnancy: Secondary | ICD-10-CM | POA: Diagnosis not present

## 2018-05-17 DIAGNOSIS — Z0371 Encounter for suspected problem with amniotic cavity and membrane ruled out: Secondary | ICD-10-CM | POA: Diagnosis not present

## 2018-05-17 DIAGNOSIS — O26893 Other specified pregnancy related conditions, third trimester: Secondary | ICD-10-CM | POA: Diagnosis not present

## 2018-05-17 DIAGNOSIS — O479 False labor, unspecified: Secondary | ICD-10-CM | POA: Diagnosis not present

## 2018-05-17 HISTORY — DX: Gastro-esophageal reflux disease without esophagitis: K21.9

## 2018-05-17 LAB — POCT FERN TEST: POCT Fern Test: NEGATIVE

## 2018-05-17 NOTE — MAU Provider Note (Signed)
S: Ms. Carmen Gibbs is a 32 y.o. G1P0 at [redacted]w[redacted]d  who presents to MAU today complaining of leaking of fluid since 1 hour prior to arrival.  She denies vaginal bleeding. She endorses contractions. She reports normal fetal movement.    O: Pulse 86   Temp 98.5 F (36.9 C) (Oral)   Resp 17   Ht 5\' 11"  (1.803 m)   Wt 96.6 kg   LMP 08/25/2017 (Exact Date)   SpO2 100%   BMI 29.71 kg/m  GENERAL: Well-developed, well-nourished female in no acute distress.  HEAD: Normocephalic, atraumatic.  CHEST: Normal effort of breathing, regular heart rate ABDOMEN: Soft, nontender, gravid PELVIC: Normal external female genitalia. Vagina is pink and rugated. Cervix with normal contour, no lesions. Normal discharge.  Negative pooling.   Cervical exam:   Dilation: Closed Effacement (%): Thick Exam by:: Shela Commons Rasch NP     Fetal Monitoring: Baseline: 125 bpm  Variability:  Moderate Accelerations: 15x15 Decelerations: none Contractions: Q2-4 mins with UI   Results for orders placed or performed during the hospital encounter of 05/17/18 (from the past 24 hour(s))  Fern Test     Status: None   Collection Time: 05/17/18  7:28 PM  Result Value Ref Range   POCT Fern Test Negative = intact amniotic membranes      A: SIUP at [redacted]w[redacted]d  Membranes intact  P:  Offered patient to stay 1-2 hours and recheck cervix. Patient declined stating she wants to go home DC home in stable condition F/U with Dr. Marcelle Overlie Return to MAU if symptoms worsen   Rasch, Harolyn Rutherford, NP 05/17/2018 8:41 PM

## 2018-05-17 NOTE — Discharge Instructions (Signed)
Braxton Hicks Contractions °Contractions of the uterus can occur throughout pregnancy, but they are not always a sign that you are in labor. You may have practice contractions called Braxton Hicks contractions. These false labor contractions are sometimes confused with true labor. °What are Braxton Hicks contractions? °Braxton Hicks contractions are tightening movements that occur in the muscles of the uterus before labor. Unlike true labor contractions, these contractions do not result in opening (dilation) and thinning of the cervix. Toward the end of pregnancy (32-34 weeks), Braxton Hicks contractions can happen more often and may become stronger. These contractions are sometimes difficult to tell apart from true labor because they can be very uncomfortable. You should not feel embarrassed if you go to the hospital with false labor. °Sometimes, the only way to tell if you are in true labor is for your health care provider to look for changes in the cervix. The health care provider will do a physical exam and may monitor your contractions. If you are not in true labor, the exam should show that your cervix is not dilating and your water has not broken. °If there are other health problems associated with your pregnancy, it is completely safe for you to be sent home with false labor. You may continue to have Braxton Hicks contractions until you go into true labor. °How to tell the difference between true labor and false labor °True labor °· Contractions last 30-70 seconds. °· Contractions become very regular. °· Discomfort is usually felt in the top of the uterus, and it spreads to the lower abdomen and low back. °· Contractions do not go away with walking. °· Contractions usually become more intense and increase in frequency. °· The cervix dilates and gets thinner. °False labor °· Contractions are usually shorter and not as strong as true labor contractions. °· Contractions are usually irregular. °· Contractions  are often felt in the front of the lower abdomen and in the groin. °· Contractions may go away when you walk around or change positions while lying down. °· Contractions get weaker and are shorter-lasting as time goes on. °· The cervix usually does not dilate or become thin. °Follow these instructions at home: °· Take over-the-counter and prescription medicines only as told by your health care provider. °· Keep up with your usual exercises and follow other instructions from your health care provider. °· Eat and drink lightly if you think you are going into labor. °· If Braxton Hicks contractions are making you uncomfortable: °? Change your position from lying down or resting to walking, or change from walking to resting. °? Sit and rest in a tub of warm water. °? Drink enough fluid to keep your urine pale yellow. Dehydration may cause these contractions. °? Do slow and deep breathing several times an hour. °· Keep all follow-up prenatal visits as told by your health care provider. This is important. °Contact a health care provider if: °· You have a fever. °· You have continuous pain in your abdomen. °Get help right away if: °· Your contractions become stronger, more regular, and closer together. °· You have fluid leaking or gushing from your vagina. °· You pass blood-tinged mucus (bloody show). °· You have bleeding from your vagina. °· You have low back pain that you never had before. °· You feel your baby’s head pushing down and causing pelvic pressure. °· Your baby is not moving inside you as much as it used to. °Summary °· Contractions that occur before labor are called Braxton   Hicks contractions, false labor, or practice contractions. °· Braxton Hicks contractions are usually shorter, weaker, farther apart, and less regular than true labor contractions. True labor contractions usually become progressively stronger and regular and they become more frequent. °· Manage discomfort from Braxton Hicks contractions by  changing position, resting in a warm bath, drinking plenty of water, or practicing deep breathing. °This information is not intended to replace advice given to you by your health care provider. Make sure you discuss any questions you have with your health care provider. °Document Released: 11/13/2016 Document Revised: 11/13/2016 Document Reviewed: 11/13/2016 °Elsevier Interactive Patient Education © 2018 [employer] ° °

## 2018-05-17 NOTE — MAU Note (Signed)
Went to OB, cervix closed, baby is breech.  Stood up and gushed water about , one time. No bleeding. Stomach is tightening.

## 2018-05-17 NOTE — Telephone Encounter (Signed)
Preadmission screen  

## 2018-05-18 ENCOUNTER — Encounter (HOSPITAL_COMMUNITY): Payer: Self-pay

## 2018-05-21 ENCOUNTER — Inpatient Hospital Stay (HOSPITAL_COMMUNITY)
Admission: AD | Admit: 2018-05-21 | Discharge: 2018-05-23 | DRG: 788 | Disposition: A | Discharge status: Home / Self Care | Payer: 59 | Attending: Obstetrics and Gynecology | Admitting: Obstetrics and Gynecology

## 2018-05-21 ENCOUNTER — Encounter (HOSPITAL_COMMUNITY)
Admission: AD | Disposition: A | Discharge status: Home / Self Care | Payer: Self-pay | Source: Home / Self Care | Attending: Obstetrics and Gynecology

## 2018-05-21 ENCOUNTER — Inpatient Hospital Stay (HOSPITAL_COMMUNITY): Payer: 59 | Admitting: Anesthesiology

## 2018-05-21 ENCOUNTER — Encounter (HOSPITAL_COMMUNITY): Payer: Self-pay | Admitting: *Deleted

## 2018-05-21 DIAGNOSIS — Z3A38 38 weeks gestation of pregnancy: Secondary | ICD-10-CM

## 2018-05-21 DIAGNOSIS — O99334 Smoking (tobacco) complicating childbirth: Secondary | ICD-10-CM | POA: Diagnosis present

## 2018-05-21 DIAGNOSIS — F17210 Nicotine dependence, cigarettes, uncomplicated: Secondary | ICD-10-CM | POA: Diagnosis present

## 2018-05-21 DIAGNOSIS — M419 Scoliosis, unspecified: Secondary | ICD-10-CM | POA: Diagnosis present

## 2018-05-21 DIAGNOSIS — O26893 Other specified pregnancy related conditions, third trimester: Secondary | ICD-10-CM | POA: Diagnosis present

## 2018-05-21 DIAGNOSIS — O321XX0 Maternal care for breech presentation, not applicable or unspecified: Secondary | ICD-10-CM | POA: Diagnosis present

## 2018-05-21 DIAGNOSIS — Z98891 History of uterine scar from previous surgery: Secondary | ICD-10-CM

## 2018-05-21 LAB — CBC
HCT: 37.3 % (ref 36.0–46.0)
HCT: 33.3 % — ABNORMAL LOW (ref 36.0–46.0)
Hemoglobin: 12.4 g/dL (ref 12.0–15.0)
Hemoglobin: 11.1 g/dL — ABNORMAL LOW (ref 12.0–15.0)
MCH: 30.5 pg (ref 26.0–34.0)
MCH: 30.5 pg (ref 26.0–34.0)
MCHC: 33.2 g/dL (ref 30.0–36.0)
MCHC: 33.3 g/dL (ref 30.0–36.0)
MCV: 91.9 fL (ref 80.0–100.0)
MCV: 91.5 fL (ref 80.0–100.0)
Platelets: 255 10*3/uL (ref 150–400)
Platelets: 226 10*3/uL (ref 150–400)
RBC: 4.06 MIL/uL (ref 3.87–5.11)
RBC: 3.64 MIL/uL — ABNORMAL LOW (ref 3.87–5.11)
RDW: 13.9 % (ref 11.5–15.5)
RDW: 13.8 % (ref 11.5–15.5)
WBC: 12.7 10*3/uL — ABNORMAL HIGH (ref 4.0–10.5)
WBC: 13.6 10*3/uL — ABNORMAL HIGH (ref 4.0–10.5)
nRBC: 0 % (ref 0.0–0.2)
nRBC: 0 % (ref 0.0–0.2)

## 2018-05-21 LAB — RPR: RPR Ser Ql: NONREACTIVE

## 2018-05-21 LAB — ABO/RH: ABO/RH(D): O NEG

## 2018-05-21 LAB — TYPE AND SCREEN
ABO/RH(D): O NEG
Antibody Screen: NEGATIVE

## 2018-05-21 SURGERY — Surgical Case
Anesthesia: Spinal | Wound class: Clean Contaminated

## 2018-05-21 MED ORDER — FAMOTIDINE IN NACL 20-0.9 MG/50ML-% IV SOLN
20.0000 mg | Freq: Once | INTRAVENOUS | Status: AC
  Administered 2018-05-21: 20 mg via INTRAVENOUS
  Filled 2018-05-21: qty 50

## 2018-05-21 MED ORDER — DIPHENHYDRAMINE HCL 25 MG PO CAPS
25.0000 mg | ORAL_CAPSULE | Freq: Four times a day (QID) | ORAL | Status: DC | PRN
  Administered 2018-05-22: 25 mg via ORAL

## 2018-05-21 MED ORDER — DIPHENHYDRAMINE HCL 50 MG/ML IJ SOLN
12.5000 mg | Freq: Four times a day (QID) | INTRAMUSCULAR | Status: DC | PRN
  Administered 2018-05-21: 12.5 mg via INTRAVENOUS

## 2018-05-21 MED ORDER — DEXAMETHASONE SODIUM PHOSPHATE 4 MG/ML IJ SOLN
INTRAMUSCULAR | Status: AC
  Filled 2018-05-21: qty 1

## 2018-05-21 MED ORDER — PROMETHAZINE HCL 25 MG/ML IJ SOLN: 6.2500 mg | INTRAMUSCULAR | Status: DC | PRN

## 2018-05-21 MED ORDER — SIMETHICONE 80 MG PO CHEW
80.0000 mg | CHEWABLE_TABLET | Freq: Three times a day (TID) | ORAL | Status: DC
  Administered 2018-05-21 – 2018-05-23 (×7): 80 mg via ORAL
  Filled 2018-05-21 (×7): qty 1

## 2018-05-21 MED ORDER — ONDANSETRON HCL 4 MG/2ML IJ SOLN
INTRAMUSCULAR | Status: DC | PRN
  Administered 2018-05-21: 4 mg via INTRAVENOUS

## 2018-05-21 MED ORDER — IBUPROFEN 600 MG PO TABS
600.0000 mg | ORAL_TABLET | Freq: Four times a day (QID) | ORAL | Status: DC
  Administered 2018-05-21 – 2018-05-23 (×8): 600 mg via ORAL
  Filled 2018-05-21 (×8): qty 1

## 2018-05-21 MED ORDER — NALOXONE HCL 0.4 MG/ML IJ SOLN: 0.4000 mg | INTRAMUSCULAR | Status: DC | PRN

## 2018-05-21 MED ORDER — MEPERIDINE HCL 25 MG/ML IJ SOLN: 6.2500 mg | INTRAMUSCULAR | Status: DC | PRN

## 2018-05-21 MED ORDER — MORPHINE SULFATE (PF) 0.5 MG/ML IJ SOLN
INTRAMUSCULAR | Status: AC
  Filled 2018-05-21: qty 10

## 2018-05-21 MED ORDER — PRENATAL MULTIVITAMIN CH
1.0000 | ORAL_TABLET | Freq: Every day | ORAL | Status: DC
  Administered 2018-05-21 – 2018-05-23 (×3): 1 via ORAL
  Filled 2018-05-21 (×3): qty 1

## 2018-05-21 MED ORDER — PHENYLEPHRINE 8 MG IN D5W 100 ML (0.08MG/ML) PREMIX OPTIME
INJECTION | INTRAVENOUS | Status: AC
  Filled 2018-05-21: qty 100

## 2018-05-21 MED ORDER — MORPHINE SULFATE (PF) 0.5 MG/ML IJ SOLN
INTRAMUSCULAR | Status: DC | PRN
  Administered 2018-05-21: .15 mg via INTRATHECAL

## 2018-05-21 MED ORDER — SCOPOLAMINE 1 MG/3DAYS TD PT72
1.0000 | MEDICATED_PATCH | Freq: Once | TRANSDERMAL | Status: DC
  Administered 2018-05-21: 1.5 mg via TRANSDERMAL

## 2018-05-21 MED ORDER — HYDROMORPHONE HCL 1 MG/ML IJ SOLN
0.2500 mg | INTRAMUSCULAR | Status: DC | PRN
  Administered 2018-05-21: 0.25 mg via INTRAVENOUS
  Administered 2018-05-21: 0.5 mg via INTRAVENOUS
  Administered 2018-05-21: 0.25 mg via INTRAVENOUS

## 2018-05-21 MED ORDER — ONDANSETRON HCL 4 MG/2ML IJ SOLN
INTRAMUSCULAR | Status: AC
  Filled 2018-05-21: qty 2

## 2018-05-21 MED ORDER — LACTATED RINGERS IV SOLN: INTRAVENOUS | Status: DC

## 2018-05-21 MED ORDER — BUPIVACAINE IN DEXTROSE 0.75-8.25 % IT SOLN
INTRATHECAL | Status: DC | PRN
  Administered 2018-05-21: 1.7 mL via INTRATHECAL

## 2018-05-21 MED ORDER — LACTATED RINGERS IV BOLUS
1000.0000 mL | Freq: Once | INTRAVENOUS | Status: AC
  Administered 2018-05-21: 1000 mL via INTRAVENOUS

## 2018-05-21 MED ORDER — OXYCODONE-ACETAMINOPHEN 5-325 MG PO TABS
1.0000 | ORAL_TABLET | ORAL | Status: DC | PRN
  Administered 2018-05-22 – 2018-05-23 (×6): 1 via ORAL
  Filled 2018-05-21 (×6): qty 1

## 2018-05-21 MED ORDER — CEFAZOLIN SODIUM-DEXTROSE 2-4 GM/100ML-% IV SOLN
2.0000 g | INTRAVENOUS | Status: AC
  Administered 2018-05-21: 2 g via INTRAVENOUS
  Filled 2018-05-21: qty 100

## 2018-05-21 MED ORDER — COCONUT OIL OIL: 1.0000 "application " | TOPICAL_OIL | Status: DC | PRN

## 2018-05-21 MED ORDER — NALBUPHINE HCL 10 MG/ML IJ SOLN: 5.0000 mg | Freq: Once | INTRAMUSCULAR | Status: AC | PRN

## 2018-05-21 MED ORDER — ONDANSETRON HCL 4 MG/2ML IJ SOLN
4.0000 mg | Freq: Three times a day (TID) | INTRAMUSCULAR | Status: DC | PRN
  Administered 2018-05-21: 4 mg via INTRAVENOUS

## 2018-05-21 MED ORDER — FENTANYL CITRATE (PF) 100 MCG/2ML IJ SOLN
INTRAMUSCULAR | Status: DC | PRN
  Administered 2018-05-21: 35 ug via INTRAVENOUS
  Administered 2018-05-21: 15 ug via INTRATHECAL
  Administered 2018-05-21: 50 ug via INTRAVENOUS

## 2018-05-21 MED ORDER — DIPHENHYDRAMINE HCL 25 MG PO CAPS
25.0000 mg | ORAL_CAPSULE | Freq: Four times a day (QID) | ORAL | Status: DC | PRN
  Filled 2018-05-21 (×2): qty 1

## 2018-05-21 MED ORDER — LACTATED RINGERS IV SOLN
INTRAVENOUS | Status: DC
  Administered 2018-05-21 – 2018-05-22 (×2): via INTRAVENOUS

## 2018-05-21 MED ORDER — DIBUCAINE 1 % RE OINT: 1.0000 "application " | TOPICAL_OINTMENT | RECTAL | Status: DC | PRN

## 2018-05-21 MED ORDER — OXYCODONE-ACETAMINOPHEN 5-325 MG PO TABS: 2.0000 | ORAL_TABLET | ORAL | Status: DC | PRN

## 2018-05-21 MED ORDER — HYDROMORPHONE HCL 1 MG/ML IJ SOLN
INTRAMUSCULAR | Status: AC
  Filled 2018-05-21: qty 1

## 2018-05-21 MED ORDER — NALBUPHINE HCL 10 MG/ML IJ SOLN
5.0000 mg | INTRAMUSCULAR | Status: DC | PRN
  Filled 2018-05-21: qty 1

## 2018-05-21 MED ORDER — LACTATED RINGERS IV SOLN
INTRAVENOUS | Status: DC
  Administered 2018-05-21: 05:00:00 via INTRAVENOUS

## 2018-05-21 MED ORDER — SIMETHICONE 80 MG PO CHEW
80.0000 mg | CHEWABLE_TABLET | ORAL | Status: DC
  Administered 2018-05-22 (×2): 80 mg via ORAL
  Filled 2018-05-21 (×2): qty 1

## 2018-05-21 MED ORDER — KETOROLAC TROMETHAMINE 30 MG/ML IJ SOLN
30.0000 mg | Freq: Once | INTRAMUSCULAR | Status: AC | PRN
  Administered 2018-05-21: 30 mg via INTRAVENOUS

## 2018-05-21 MED ORDER — DIPHENHYDRAMINE HCL 12.5 MG/5ML PO ELIX
12.5000 mg | ORAL_SOLUTION | Freq: Four times a day (QID) | ORAL | Status: DC | PRN
  Filled 2018-05-21: qty 5

## 2018-05-21 MED ORDER — OXYTOCIN 10 UNIT/ML IJ SOLN
INTRAMUSCULAR | Status: AC
  Filled 2018-05-21: qty 4

## 2018-05-21 MED ORDER — DIPHENHYDRAMINE HCL 50 MG/ML IJ SOLN: 12.5000 mg | Freq: Four times a day (QID) | INTRAMUSCULAR | Status: DC | PRN

## 2018-05-21 MED ORDER — MENTHOL 3 MG MT LOZG: 1.0000 | LOZENGE | OROMUCOSAL | Status: DC | PRN

## 2018-05-21 MED ORDER — WITCH HAZEL-GLYCERIN EX PADS: 1.0000 "application " | MEDICATED_PAD | CUTANEOUS | Status: DC | PRN

## 2018-05-21 MED ORDER — ACETAMINOPHEN 10 MG/ML IV SOLN: 1000.0000 mg | Freq: Once | INTRAVENOUS | Status: DC | PRN

## 2018-05-21 MED ORDER — ZOLPIDEM TARTRATE 5 MG PO TABS: 5.0000 mg | ORAL_TABLET | Freq: Every evening | ORAL | Status: DC | PRN

## 2018-05-21 MED ORDER — DEXTROSE 5 % IV SOLN: 3.0000 g | INTRAVENOUS | Status: DC

## 2018-05-21 MED ORDER — NALBUPHINE HCL 10 MG/ML IJ SOLN
5.0000 mg | Freq: Once | INTRAMUSCULAR | Status: AC | PRN
  Administered 2018-05-21: 5 mg via SUBCUTANEOUS

## 2018-05-21 MED ORDER — FAMOTIDINE IN NACL 20-0.9 MG/50ML-% IV SOLN: 20.0000 mg | Freq: Once | INTRAVENOUS | Status: DC

## 2018-05-21 MED ORDER — SOD CITRATE-CITRIC ACID 500-334 MG/5ML PO SOLN: 30.0000 mL | Freq: Once | ORAL | Status: DC

## 2018-05-21 MED ORDER — SIMETHICONE 80 MG PO CHEW
80.0000 mg | CHEWABLE_TABLET | ORAL | Status: DC | PRN
  Administered 2018-05-22: 80 mg via ORAL
  Filled 2018-05-21: qty 1

## 2018-05-21 MED ORDER — NALOXONE HCL 4 MG/10ML IJ SOLN
1.0000 ug/kg/h | INTRAVENOUS | Status: DC | PRN
  Filled 2018-05-21: qty 5

## 2018-05-21 MED ORDER — TETANUS-DIPHTH-ACELL PERTUSSIS 5-2.5-18.5 LF-MCG/0.5 IM SUSP: 0.5000 mL | Freq: Once | INTRAMUSCULAR | Status: DC

## 2018-05-21 MED ORDER — PHENYLEPHRINE 40 MCG/ML (10ML) SYRINGE FOR IV PUSH (FOR BLOOD PRESSURE SUPPORT)
PREFILLED_SYRINGE | INTRAVENOUS | Status: AC
  Filled 2018-05-21: qty 10

## 2018-05-21 MED ORDER — KETOROLAC TROMETHAMINE 30 MG/ML IJ SOLN
INTRAMUSCULAR | Status: AC
  Filled 2018-05-21: qty 1

## 2018-05-21 MED ORDER — OXYTOCIN 10 UNIT/ML IJ SOLN
INTRAVENOUS | Status: DC | PRN
  Administered 2018-05-21: 40 [IU] via INTRAVENOUS

## 2018-05-21 MED ORDER — OXYTOCIN 40 UNITS IN LACTATED RINGERS INFUSION - SIMPLE MED: 2.5000 [IU]/h | INTRAVENOUS | Status: AC

## 2018-05-21 MED ORDER — SOD CITRATE-CITRIC ACID 500-334 MG/5ML PO SOLN
30.0000 mL | Freq: Once | ORAL | Status: AC
  Administered 2018-05-21: 30 mL via ORAL
  Filled 2018-05-21: qty 15

## 2018-05-21 MED ORDER — ACETAMINOPHEN 325 MG PO TABS
650.0000 mg | ORAL_TABLET | ORAL | Status: DC | PRN
  Administered 2018-05-21: 650 mg via ORAL
  Filled 2018-05-21: qty 2

## 2018-05-21 MED ORDER — SODIUM CHLORIDE 0.9% FLUSH: 3.0000 mL | INTRAVENOUS | Status: DC | PRN

## 2018-05-21 MED ORDER — DIPHENHYDRAMINE HCL 50 MG/ML IJ SOLN
INTRAMUSCULAR | Status: AC
  Filled 2018-05-21: qty 1

## 2018-05-21 MED ORDER — DEXAMETHASONE SODIUM PHOSPHATE 4 MG/ML IJ SOLN
INTRAMUSCULAR | Status: DC | PRN
  Administered 2018-05-21: 4 mg via INTRAVENOUS

## 2018-05-21 MED ORDER — SODIUM CHLORIDE 0.9% FLUSH: 9.0000 mL | INTRAVENOUS | Status: DC | PRN

## 2018-05-21 MED ORDER — OXYCODONE HCL 5 MG/5ML PO SOLN: 5.0000 mg | Freq: Once | ORAL | Status: DC | PRN

## 2018-05-21 MED ORDER — SCOPOLAMINE 1 MG/3DAYS TD PT72
MEDICATED_PATCH | TRANSDERMAL | Status: AC
  Filled 2018-05-21: qty 1

## 2018-05-21 MED ORDER — BUPIVACAINE HCL (PF) 0.25 % IJ SOLN
INTRAMUSCULAR | Status: AC
  Filled 2018-05-21: qty 30

## 2018-05-21 MED ORDER — PHENYLEPHRINE 8 MG IN D5W 100 ML (0.08MG/ML) PREMIX OPTIME
INJECTION | INTRAVENOUS | Status: DC | PRN
  Administered 2018-05-21: 60 ug/min via INTRAVENOUS

## 2018-05-21 MED ORDER — NALBUPHINE HCL 10 MG/ML IJ SOLN
5.0000 mg | INTRAMUSCULAR | Status: DC | PRN
  Administered 2018-05-21: 5 mg via INTRAVENOUS
  Filled 2018-05-21: qty 1

## 2018-05-21 MED ORDER — ONDANSETRON HCL 4 MG/2ML IJ SOLN: 4.0000 mg | Freq: Four times a day (QID) | INTRAMUSCULAR | Status: DC | PRN

## 2018-05-21 MED ORDER — OXYCODONE HCL 5 MG PO TABS: 5.0000 mg | ORAL_TABLET | Freq: Once | ORAL | Status: DC | PRN

## 2018-05-21 MED ORDER — HYDROMORPHONE 1 MG/ML IV SOLN
INTRAVENOUS | Status: DC
  Administered 2018-05-21: 30 mg via INTRAVENOUS
  Administered 2018-05-21: 2.1 mg via INTRAVENOUS
  Administered 2018-05-21: 1.8 mg via INTRAVENOUS
  Administered 2018-05-22: 2.2 mg via INTRAVENOUS
  Administered 2018-05-22: 0.2 mg via INTRAVENOUS
  Filled 2018-05-21: qty 25

## 2018-05-21 MED ORDER — SENNOSIDES-DOCUSATE SODIUM 8.6-50 MG PO TABS
2.0000 | ORAL_TABLET | ORAL | Status: DC
  Administered 2018-05-22 (×2): 2 via ORAL
  Filled 2018-05-21 (×2): qty 2

## 2018-05-21 MED ORDER — FENTANYL CITRATE (PF) 100 MCG/2ML IJ SOLN
INTRAMUSCULAR | Status: AC
  Filled 2018-05-21: qty 2

## 2018-05-21 SURGICAL SUPPLY — 33 items
BENZOIN TINCTURE PRP APPL 2/3 (GAUZE/BANDAGES/DRESSINGS) ×2 IMPLANT
CHLORAPREP W/TINT 26ML (MISCELLANEOUS) ×2 IMPLANT
CLAMP CORD UMBIL (MISCELLANEOUS) IMPLANT
CLOSURE STERI STRIP 1/2 X4 (GAUZE/BANDAGES/DRESSINGS) ×2 IMPLANT
CLOTH BEACON ORANGE TIMEOUT ST (SAFETY) ×2 IMPLANT
DERMABOND ADVANCED (GAUZE/BANDAGES/DRESSINGS)
DERMABOND ADVANCED .7 DNX12 (GAUZE/BANDAGES/DRESSINGS) IMPLANT
DRSG OPSITE POSTOP 4X10 (GAUZE/BANDAGES/DRESSINGS) ×2 IMPLANT
ELECT REM PT RETURN 9FT ADLT (ELECTROSURGICAL) ×2
ELECTRODE REM PT RTRN 9FT ADLT (ELECTROSURGICAL) ×1 IMPLANT
EXTRACTOR VACUUM M CUP 4 TUBE (SUCTIONS) IMPLANT
GLOVE BIO SURGEON STRL SZ7.5 (GLOVE) ×2 IMPLANT
GLOVE BIOGEL PI IND STRL 7.0 (GLOVE) ×1 IMPLANT
GLOVE BIOGEL PI INDICATOR 7.0 (GLOVE) ×1
GOWN STRL REUS W/TWL LRG LVL3 (GOWN DISPOSABLE) ×4 IMPLANT
KIT ABG SYR 3ML LUER SLIP (SYRINGE) ×2 IMPLANT
NEEDLE HYPO 25X5/8 SAFETYGLIDE (NEEDLE) ×2 IMPLANT
NS IRRIG 1000ML POUR BTL (IV SOLUTION) ×2 IMPLANT
PACK C SECTION WH (CUSTOM PROCEDURE TRAY) ×2 IMPLANT
PAD OB MATERNITY 4.3X12.25 (PERSONAL CARE ITEMS) ×2 IMPLANT
PENCIL SMOKE EVAC W/HOLSTER (ELECTROSURGICAL) ×2 IMPLANT
SPONGE LAP 18X18 RF (DISPOSABLE) ×6 IMPLANT
STRIP CLOSURE SKIN 1/2X4 (GAUZE/BANDAGES/DRESSINGS) IMPLANT
SUT MNCRL 0 VIOLET CTX 36 (SUTURE) ×4 IMPLANT
SUT MONOCRYL 0 CTX 36 (SUTURE) ×4
SUT PDS AB 0 CTX 60 (SUTURE) ×2 IMPLANT
SUT PLAIN 0 NONE (SUTURE) IMPLANT
SUT PLAIN 2 0 (SUTURE)
SUT PLAIN 2 0 XLH (SUTURE) IMPLANT
SUT PLAIN ABS 2-0 CT1 27XMFL (SUTURE) IMPLANT
SUT VIC AB 4-0 KS 27 (SUTURE) ×2 IMPLANT
TOWEL OR 17X24 6PK STRL BLUE (TOWEL DISPOSABLE) ×2 IMPLANT
TRAY FOLEY W/BAG SLVR 14FR LF (SET/KITS/TRAYS/PACK) ×2 IMPLANT

## 2018-05-21 NOTE — Transfer of Care (Signed)
Immediate Anesthesia Transfer of Care Note  Patient: Carmen Gibbs  Procedure(s) Performed: CESAREAN SECTION (N/A )  Patient Location: PACU  Anesthesia Type:Spinal  Level of Consciousness: awake, alert  and oriented  Airway & Oxygen Therapy: Patient Spontanous Breathing  Post-op Assessment: Report given to RN and Post -op Vital signs reviewed and stable  Post vital signs: Reviewed and stable HR 66, RR 16, SaO2 100%, BP 110/70  Last Vitals:  Vitals Value Taken Time  BP    Temp    Pulse 66 05/21/2018  6:22 AM  Resp 17 05/21/2018  6:22 AM  SpO2 100 % 05/21/2018  6:22 AM  Vitals shown include unvalidated device data.  Last Pain:  Vitals:   05/21/18 0316  TempSrc: Oral  PainSc: 7          Complications: No apparent anesthesia complications

## 2018-05-21 NOTE — Progress Notes (Signed)
Pt c/o increase in pain over last hour.  L>R.  No n/v - just finished all of her solid lunch tray.  No lightheadedness or dizziness.    AF< VSS Gen - NAD Abd - soft & ND.  Tender in LLQ Ext - NT  A/P:  Will order CBC and PCA

## 2018-05-21 NOTE — Anesthesia Procedure Notes (Signed)
Spinal  Patient location during procedure: OR Start time: 05/21/2018 5:15 AM End time: 05/21/2018 5:25 AM Staffing Anesthesiologist: Leonides Grills, MD Performed: anesthesiologist  Preanesthetic Checklist Completed: patient identified, surgical consent, pre-op evaluation, timeout performed, IV checked, risks and benefits discussed and monitors and equipment checked Spinal Block Patient position: sitting Prep: DuraPrep Patient monitoring: cardiac monitor, continuous pulse ox and blood pressure Approach: midline Location: L4-5 Injection technique: single-shot Needle Needle type: Pencan  Needle gauge: 24 G Needle length: 9 cm Assessment Sensory level: T10 Additional Notes Functioning IV was confirmed and monitors were applied. Sterile prep and drape, including hand hygiene and sterile gloves were used. The patient was positioned and the spine was prepped. The skin was anesthetized with lidocaine.  Free flow of clear CSF was obtained prior to injecting local anesthetic into the CSF.  The spinal needle aspirated freely following injection.  The needle was carefully withdrawn.  The patient tolerated the procedure well.

## 2018-05-21 NOTE — Anesthesia Postprocedure Evaluation (Signed)
Anesthesia Post Note  Patient: Carmen Gibbs  Procedure(s) Performed: CESAREAN SECTION (N/A )     Patient location during evaluation: Mother Baby Anesthesia Type: Spinal Level of consciousness: awake, awake and alert, oriented and patient cooperative Pain management: pain level controlled Vital Signs Assessment: post-procedure vital signs reviewed and stable Respiratory status: spontaneous breathing Cardiovascular status: blood pressure returned to baseline and stable Postop Assessment: no headache, no backache, spinal receding and patient able to bend at knees Anesthetic complications: no    Last Vitals:  Vitals:   05/21/18 0820 05/21/18 0930  BP: 102/68 104/62  Pulse: (!) 54 (!) 58  Resp: 18 18  Temp: 37.6 C   SpO2: 100% 99%    Last Pain:  Vitals:   05/21/18 0930  TempSrc:   PainSc: 5    Pain Goal:                 Jennelle Human

## 2018-05-21 NOTE — Addendum Note (Signed)
Addendum  created 05/21/18 1016 by Jennelle Human, CRNA   Sign clinical note

## 2018-05-21 NOTE — MAU Note (Signed)
PT SAYS  BABY IS BREECH - C/S - [facility]  FOR 11-14.  UC  HURT BAD AT 0140.   VE IN  MAU-  CLOSED. Marland Kitchen

## 2018-05-21 NOTE — MAU Provider Note (Signed)
Pt informed that the ultrasound is considered a limited OB ultrasound and is not intended to be a complete ultrasound exam.  Patient also informed that the ultrasound is not being completed with the intent of assessing for fetal or placental anomalies or any pelvic abnormalities.  Explained that the purpose of today's ultrasound is to assess for presentation of the fetus.  Patient acknowledges the purpose of the exam and the limitations of the study.    Fetus is noted to be in the breech position, unclear if frank or complete  Dr Vincente Poli notified  Prepare for OR

## 2018-05-21 NOTE — Op Note (Signed)
NAME: Carmen Gibbs, Carmen Gibbs MEDICAL RECORD ZO:[id number] ACCOUNT [id number] DATE OF BIRTH:[birth year] FACILITY: [facility] LOCATION: [facility] PHYSICIAN:Joseff Luckman Rosita Fire, MD  OPERATIVE REPORT  DATE OF PROCEDURE:  05/21/2018  PREOPERATIVE DIAGNOSIS:  Intrauterine pregnancy at 38 weeks and 3 days in labor in breech presentation.  POSTOPERATIVE DIAGNOSIS:  Intrauterine pregnancy at 38 weeks and 3 days in labor in breech presentation.  PROCEDURE:  Primary low transverse cesarean section.  SURGEON:  Marcelle Overlie, MD  ANESTHESIA:  Spinal.  ESTIMATED BLOOD LOSS:  Less than 500.  COMPLICATIONS:  None.  DESCRIPTION OF PROCEDURE:  The patient was taken to the operating room where spinal was placed and she was placed in the supine position and a Foley catheter was inserted.  Time out was performed.  A sterile drape was applied.  A low transverse incision  was made, carried down to the fascia.  Fascia was scored in the midline and extended laterally.  The rectus muscles were separated in the midline.  The peritoneum was entered bluntly and the peritoneal incision was then stretched.  The bladder blade was  inserted.  The bladder flap was created sharply and digitally.  The bladder blade was then readjusted.  A low transverse incision was made in the uterus.  Uterus was entered using a hemostat.  The baby was a double footling breech presentation with cord  around the legs.  The feet were delivered easily and the baby was delivered via complete breech extraction.  It was a female infant, Apgars 9 at 1 minute and 9 at 5 minutes.  The cord was clamped and cut.  The baby was handed to the waiting neonatal team.   Placenta was manually removed, noted to be normal and intact with a 3-vessel cord.  The uterus was exteriorized and cleared of all clots and debris.  The uterine incision was closed in 1 layer using 0 chromic in a running locked stitch.  Irrigation was  performed.  The uterus was returned to the  abdomen.  The peritoneum was closed using 0 Vicryl in a running stitch.  The fascia was closed using 0 Vicryl in a running stitch.  After irrigation, the skin was closed with 3-0 Vicryl on a Keith needle.  All  sponge, lap and instrument counts were correct x2.  The patient went to recovery room in stable condition.  TN/NUANCE  D:05/21/2018 T:05/21/2018 JOB:[id number]

## 2018-05-21 NOTE — Brief Op Note (Signed)
05/21/2018  6:03 AM  PATIENT:  Carmen Gibbs  32 y.o. female  PRE-OPERATIVE DIAGNOSIS:  IUP at 30 w 3 days Labor Breech  POST-OPERATIVE DIAGNOSIS:   Same  PROCEDURE:  Procedure(s) with comments: CESAREAN SECTION (N/A) - Primary edc 06/01/18 Odelia Gage, RNFA  SURGEON:  Surgeon(s) and Role:    * Marcelle Overlie, MD - Primary  PHYSICIAN ASSISTANT:   ASSISTANTS: none   ANESTHESIA:   spinal  EBL:  500  BLOOD ADMINISTERED:none  DRAINS: Urinary Catheter (Foley)   LOCAL MEDICATIONS USED:  NONE  SPECIMEN:  No Specimen  DISPOSITION OF SPECIMEN:  N/A  COUNTS:  YES  TOURNIQUET:  * No tourniquets in log *  DICTATION: .Other Dictation: Dictation Number dictated  PLAN OF CARE: Admit to inpatient   PATIENT DISPOSITION:  PACU - hemodynamically stable.   Delay start of Pharmacological VTE agent (>24hrs) due to surgical blood loss or risk of bleeding: not applicable

## 2018-05-21 NOTE — H&P (Signed)
Carmen Gibbs is a 32 y.o. G 1 P 0 at 38 w 3 days presents in labor and is BREECH. OB History    Gravida  1   Para      Term      Preterm      AB      Living        SAB      TAB      Ectopic      Multiple      Live Births             Past Medical History:  Diagnosis Date  . Allergy   . GERD (gastroesophageal reflux disease)   . History of PCOS   . Mental disorder    bipolar   . Scoliosis    Past Surgical History:  Procedure Laterality Date  . CHOLECYSTECTOMY    . WISDOM TOOTH EXTRACTION     Family History: family history includes Basal cell carcinoma in her mother; Bladder Cancer in her father; Breast cancer in her paternal grandmother; Diabetes in her mother; Hypertension in her father. Social History:  reports that she has been smoking cigarettes. She has been smoking about 0.50 packs per day. She has never used smokeless tobacco. She reports that she drank about 4.0 standard drinks of alcohol per week. She reports that she does not use drugs.     Maternal Diabetes: No Genetic Screening: Normal Maternal Ultrasounds/Referrals: Normal Fetal Ultrasounds or other Referrals:  None Maternal Substance Abuse:  No Significant Maternal Medications:  None Significant Maternal Lab Results:  None Other Comments:  None  Review of Systems  All other systems reviewed and are negative.  Maternal Medical History:  Reason for admission: Contractions.     Dilation: 2.5 Effacement (%): 20 Station: Ballotable Exam by:: T Lytle RN  Blood pressure 129/74, pulse 97, temperature 98.4 F (36.9 C), temperature source Oral, resp. rate 20, height 5\' 11"  (1.803 m), weight 96.4 kg, last menstrual period 08/25/2017. Maternal Exam:  Abdomen: Fetal presentation: breech     Fetal Exam Fetal State Assessment: Category I - tracings are normal.     Physical Exam  Nursing note and vitals reviewed. Constitutional: She appears well-developed and well-nourished.   HENT:  Head: Normocephalic and atraumatic.  Eyes: Pupils are equal, round, and reactive to light.  Neck: Normal range of motion.  Cardiovascular: Normal rate and regular rhythm.  Respiratory: Effort normal.    Prenatal labs: ABO, Rh: O/Negative/-- (04/09 0000) Antibody: Negative (04/09 0000) Rubella: Immune (04/09 0000) RPR: Nonreactive (04/09 0000)  HBsAg: Negative (04/09 0000)  HIV: Non-reactive (04/09 0000)  GBS:     Assessment/Plan: IUP at 38 w 3 days Labor Breech Primary LTCS risks reviewed  Consent signed  [place] Spainhower L 05/21/2018, 4:29 AM

## 2018-05-21 NOTE — Lactation Note (Signed)
This note was copied from a baby's chart. Lactation Consultation Note  Patient Name: Carmen Gibbs Mefferd [id number] Date: 05/21/2018 Reason for consult: Initial assessment;Primapara;Early term 41-38.6wks Baby is 5 hours old and he has a few attempts at breast.  Latch score 5-6.  A nipple shield was initiated due to small, short nipples.  Baby is currently sleeping in crib but mom would like to attempt a feeding.  Normal first 24 hour behavior discussed.  Assisted with postioning baby in football hold.  Colostrum easily hand expressed.  Baby not showing feeding cues or opening mouth.  Attempted first without shield and then with 16 mm nipple shield.  No suck elicited.  No suck on gloved finger.  Discussed importance of pumping if nipple shield needed.  Symphony pump set up but not initiated yet.  Instructed to watch for feeding cues and call for assist.  Baby left skin to skin on mother's chest.  Maternal Data Has patient been taught Hand Expression?: Yes Does the patient have breastfeeding experience prior to this delivery?: No  Feeding Feeding Type: Breast Fed  LATCH Score Latch: Too sleepy or reluctant, no latch achieved, no sucking elicited.  Audible Swallowing: None  Type of Nipple: Everted at rest and after stimulation(SHORT)  Comfort (Breast/Nipple): Soft / non-tender  Hold (Positioning): Assistance needed to correctly position infant at breast and maintain latch.  LATCH Score: 5  Interventions Interventions: Assisted with latch;Breast compression;Skin to skin;Adjust position;Breast massage;Hand express;Support pillows;DEBP  Lactation Tools Discussed/Used Tools: Nipple Shields Nipple shield size: 16 Pump Review: Setup, frequency, and cleaning Initiated by:: LM Date initiated:: 05/21/18   Consult Status Consult Status: Follow-up Date: 05/22/18 Follow-up type: In-patient    Huston Foley 05/21/2018, 11:10 AM

## 2018-05-21 NOTE — Anesthesia Postprocedure Evaluation (Signed)
Anesthesia Post Note  Patient: Carmen Gibbs  Procedure(s) Performed: CESAREAN SECTION (N/A )     Patient location during evaluation: PACU Anesthesia Type: Spinal Level of consciousness: oriented and awake and alert Pain management: pain level controlled Vital Signs Assessment: post-procedure vital signs reviewed and stable Respiratory status: spontaneous breathing, respiratory function stable and nonlabored ventilation Cardiovascular status: blood pressure returned to baseline and stable Postop Assessment: no headache, no backache, no apparent nausea or vomiting, patient able to bend at knees and spinal receding Anesthetic complications: no    Last Vitals:  Vitals:   05/21/18 0745 05/21/18 0818  BP: 97/61 (P) 102/68  Pulse: 65   Resp: 14   Temp:    SpO2: 100%     Last Pain:  Vitals:   05/21/18 0812  TempSrc:   PainSc: 4    Pain Goal:                 Anael Rosch A.

## 2018-05-21 NOTE — Anesthesia Preprocedure Evaluation (Signed)
Anesthesia Evaluation  Patient identified by MRN, date of birth, ID band Patient awake    Reviewed: Allergy & Precautions, NPO status , Patient's Chart, lab work & pertinent test results  Airway Mallampati: II  TM Distance: >3 FB Neck ROM: Full    Dental no notable dental hx.    Pulmonary neg pulmonary ROS, Current Smoker,    Pulmonary exam normal breath sounds clear to auscultation       Cardiovascular negative cardio ROS Normal cardiovascular exam Rhythm:Regular Rate:Normal     Neuro/Psych PSYCHIATRIC DISORDERS Bipolar Disorder negative neurological ROS     GI/Hepatic Neg liver ROS, GERD  Controlled,  Endo/Other  History of PCOS  Renal/GU negative Renal ROS     Musculoskeletal negative musculoskeletal ROS (+) Scoliosis   Abdominal   Peds  Hematology negative hematology ROS (+)   Anesthesia Other Findings breech  Reproductive/Obstetrics (+) Pregnancy                             Anesthesia Physical Anesthesia Plan  ASA: II and emergent  Anesthesia Plan: Spinal   Post-op Pain Management:    Induction:   PONV Risk Score and Plan: 1 and Scopolamine patch - Pre-op, Dexamethasone, Ondansetron and Treatment may vary due to age or medical condition  Airway Management Planned: Natural Airway  Additional Equipment:   Intra-op Plan:   Post-operative Plan:   Informed Consent: I have reviewed the patients History and Physical, chart, labs and discussed the procedure including the risks, benefits and alternatives for the proposed anesthesia with the patient or authorized representative who has indicated his/her understanding and acceptance.   Dental advisory given  Plan Discussed with: CRNA  Anesthesia Plan Comments:         Anesthesia Quick Evaluation

## 2018-05-21 NOTE — Progress Notes (Signed)
Pt currently on PCA pump. Pt reports that pain is not as bad (4/10) and that she is starting to feel better. Instructed patient on how often she could use the PCA pump. Pt verbalizes understanding. Patient tolerating PO fluids and meals. Respiratory rate and depth are WNL. Pt has no other complaints at this time. Will continue to monitor.

## 2018-05-21 NOTE — Progress Notes (Signed)
Pt called out stating that she wanted pain medication. Pt rated her pain at 7/10. I got another mother/baby RN, Karl Luke to assess the patient with me. Pt is very sensitive to abdomen being touched. The pain starts at her umbilicus and spreads to her left side. This pain is above the incision site. Patient's bleeding continues to be small, uterus is firm and midline.  Called Dr. Vickey Sages at 1154, to receive orders and to request MD eval of patient. Dr. Vickey Sages stated that she would come evaluate patient after finishing up at the office. Called anesthesia at 1158 to make them aware of patient situation. Dr. Malen Gauze requested that Dr. Vickey Sages call him to discuss orders. Called Dr. Vickey Sages at 1225 to ask her to call Dr. Malen Gauze before initiating PCA pump.

## 2018-05-22 LAB — CBC
HCT: 30.4 % — ABNORMAL LOW (ref 36.0–46.0)
Hemoglobin: 10.1 g/dL — ABNORMAL LOW (ref 12.0–15.0)
MCH: 30.8 pg (ref 26.0–34.0)
MCHC: 33.2 g/dL (ref 30.0–36.0)
MCV: 92.7 fL (ref 80.0–100.0)
Platelets: 205 10*3/uL (ref 150–400)
RBC: 3.28 MIL/uL — ABNORMAL LOW (ref 3.87–5.11)
RDW: 14.1 % (ref 11.5–15.5)
WBC: 11.9 10*3/uL — ABNORMAL HIGH (ref 4.0–10.5)
nRBC: 0 % (ref 0.0–0.2)

## 2018-05-22 LAB — BIRTH TISSUE RECOVERY COLLECTION (PLACENTA DONATION)

## 2018-05-22 NOTE — Progress Notes (Signed)
Subjective: Postpartum Day 1: Cesarean Delivery Patient reports tolerating PO and no problems voiding.    Objective: Vital signs in last 24 hours: Temp:  [97.8 F (36.6 C)-98.7 F (37.1 C)] 98 F (36.7 C) (11/09 0422) Pulse Rate:  [48-62] 62 (11/09 0422) Resp:  [15-18] 18 (11/09 0532) BP: (91-104)/(47-62) 99/56 (11/09 0422) SpO2:  [96 %-100 %] 97 % (11/09 0532)  Physical Exam:  General: alert and cooperative Lochia: appropriate Uterine Fundus: firm Incision: healing well, no significant drainage DVT Evaluation: No evidence of DVT seen on physical exam.  Recent Labs    05/21/18 1321 05/22/18 0549  HGB 11.1* 10.1*  HCT 33.3* 30.4*    Assessment/Plan: Status post Cesarean section. Doing well postoperatively.  Continue current care. D/c PCA and advance to PO meds  Carmen Gibbs 05/22/2018, 8:46 AM

## 2018-05-22 NOTE — Progress Notes (Signed)
CLINICAL SOCIAL WORK MATERNAL/CHILD NOTE  Patient Details  Name: Carmen Gibbs MRN: [id number] Date of Birth: 05/21/2016  Date:  05/22/2018  Clinical Social Worker Initiating Note:  Celso Sickle, [place]   Date/Time: Initiated:  05/22/18/1153             Child's Name:    Carmen Gibbs  Biological Parents:  Mother, Father(Patrick Para March 03/20/87)   Need for Interpreter:  None   Reason for Referral:  Behavioral Health Concerns(MOB has a hx of bipolar)   Address:  [address] [place] [address]    Phone number:  [phone] (home)     Additional phone number:   Household Members/Support Persons (HM/SP):   Household Member/Support Person 1   HM/SP Name Relationship DOB or Age  HM/SP -1 Megan Salon FOB 03/20/1987  HM/SP -2     HM/SP -3     HM/SP -4     HM/SP -5     HM/SP -6     HM/SP -7     HM/SP -8       Natural Supports (not living in the home): Parent, Immediate Family(Mom; Sisters; MOB reports that she has a great support system)   Professional Supports:None   Employment:Full-time   Type of Work: Psychologist, occupational [place])   Education:  Some Materials engineer arranged:    Social worker   Other Resources:     Cultural/Religious Considerations Which May Impact Care:   Strengths: Ability to meet basic needs , Home prepared for child , Understanding of illness, Pediatrician chosen   Psychotropic Medications:         Pediatrician:    Armed forces operational officer area  Pediatrician List:   [facility]  [place]   [place]   [place] [facility]     Pediatrician Fax Number:    Risk Factors/Current Problems: None   Cognitive State: Able to Concentrate , Alert , Linear Thinking    Mood/Affect: Comfortable , Calm , Happy    CSW Assessment:CSW spoke with MOB at  bedside regarding consult for hx of bipolar. MOB accompanied by FOB and another female that was holding infant. CSW asked for MOB's permission to ask guests to leave the room during assessment. MOB asked CSW if they could walk to complete assessment because she wanted her family to bond with infant, CSW agreed to come back at a later time to assess MOB. MOB asked if CSW could complete assessment with supports present, CSW agreed. CSW and MOB discussed MOB's mental health hx, MOB reported that she has been diagnosed with Bipolar I disorder and has not had any symptoms in a while. MOB reported that she recently got a new psychiatrist in [place] who was going to reevaluate her diagnosis of Bipolar I disorder because MOB was not having any symptoms. MOB reported that she is not currently taking any psychotropic medications and has an appointment with her new psychiatrist on 06/17/2018. MOB reported that when she was diagnosed with Bipolar I disorder she was grieving the lost of her dad. CSW assessed for safety, MOB denied SI, HI, and domestic violence. MOB presented with insight and awareness about her mental health history and did not demonstrate any acute mental health signs or symptoms. MOB verbalized plan to follow up with her psychiatrist. MOB reported that she has a great support system and everything that she needs to care for the baby.  CSW provided education regarding the baby blues period vs. perinatal mood disorders, discussed treatment and gave resources for mental health follow up if concerns arise.  CSW recommends self-evaluation during the postpartum time period using the New Mom Checklist from Postpartum Progress and encouraged MOB to contact a medical professional if symptoms are noted at any time.    CSW identifies no further need for intervention and no barriers to discharge at this time.  CSW Plan/Description: No Further Intervention Required/No Barriers to Discharge, Perinatal Mood and  Anxiety Disorder (PMADs) Education    Antionette Poles, LCSW 05/22/2018, 11:59 AM

## 2018-05-22 NOTE — Lactation Note (Signed)
This note was copied from a baby's chart. Lactation Consultation Note  Patient Name: Boy Allessandra Bernardi [id number] Date: 05/22/2018 Reason for consult: Follow-up assessment   Baby 32 hours old and has been sleepy at the breast. Mother states he has been latching well with #16NS. Attempted after recent 5 min on L and 10 min on R feeding and baby did not sustain suck. Reviewed how to use DEBP and mother pumped 3 ml of colostrum. Reviewed how to spoon feed.  Tried #21 flanges which mother states were comfortable as well as #24. Discussed cluster feeding. Mom has been encouraged to pump q3h for 15-20 min.     Maternal Data    Feeding    LATCH Score Latch: Too sleepy or reluctant, no latch achieved, no sucking elicited.  Audible Swallowing: None  Type of Nipple: Everted at rest and after stimulation  Comfort (Breast/Nipple): Soft / non-tender  Hold (Positioning): Assistance needed to correctly position infant at breast and maintain latch.  LATCH Score: 5  Interventions Interventions: Breast feeding basics reviewed;Assisted with latch;Hand express;DEBP  Lactation Tools Discussed/Used Tools: Nipple Shields Nipple shield size: 16   Consult Status Consult Status: Follow-up Date: 05/23/18 Follow-up type: In-patient    Dahlia Byes [facility] 05/22/2018, 2:06 PM

## 2018-05-22 NOTE — Progress Notes (Signed)
Pt Hydromorphone PCA pump d/c per md order. 22 ml of Hydromorphone wasted in sink and witnessed by Everardo All RN.

## 2018-05-23 ENCOUNTER — Encounter (HOSPITAL_COMMUNITY): Payer: Self-pay | Admitting: *Deleted

## 2018-05-23 MED ORDER — OXYCODONE-ACETAMINOPHEN 5-325 MG PO TABS: 1.0000 | ORAL_TABLET | ORAL | 0 refills | Status: DC | PRN

## 2018-05-23 MED ORDER — IBUPROFEN 600 MG PO TABS: 600.0000 mg | ORAL_TABLET | Freq: Four times a day (QID) | ORAL | 0 refills | Status: AC

## 2018-05-23 NOTE — Discharge Summary (Signed)
Obstetric Discharge Summary Reason for Admission: onset of labor Prenatal Procedures: ultrasound Intrapartum Procedures: cesarean: low cervical, transverse Postpartum Procedures: none Complications-Operative and Postpartum: none Hemoglobin  Date Value Ref Range Status  05/22/2018 10.1 (L) 12.0 - 15.0 g/dL Final   HCT  Date Value Ref Range Status  05/22/2018 30.4 (L) 36.0 - 46.0 % Final    Physical Exam:  General: alert and cooperative Lochia: appropriate Uterine Fundus: firm Incision: healing well, no significant drainage DVT Evaluation: No evidence of DVT seen on physical exam.  Discharge Diagnoses: Term Pregnancy-delivered  Discharge Information: Date: 05/23/2018 Activity: pelvic rest Diet: routine Medications: PNV, Ibuprofen and Percocet Condition: stable Instructions: refer to practice specific booklet Discharge to: home Follow-up Information    [place] Schedule an appointment as soon as possible for a visit in 2 week(s).   Contact information: [address] [place] [address] [phone]           Newborn Data: Live born female  Birth Weight: 6 lb 11.2 oz (3040 g) APGAR: 9, 9  Newborn Delivery   Birth date/time:  05/21/2016 05:41:00 Delivery type:  C-Section, Low Transverse Trial of labor:  No C-section categorization:  Primary     Home with mother.  Zelphia Cairo 05/23/2018, 9:37 AM

## 2018-05-23 NOTE — Lactation Note (Addendum)
This note was copied from a baby's chart. Lactation Consultation Note  Patient Name: Carmen Gibbs [id number] Date: 05/23/2018   [name] with 9 percent weight loss at 84 hours old, First time breastfeeding. Parents report they are starting to figure out breastfeeding.  Mom reports breastfeeding and pumping with DEBP.  Mom reports starting to feel some breast changes today.  Feels they are getting heavy.Mom reports pumping every 3 hours so sometimes has to interrupt pumping to feed him.   Urged parents to change it up a little bit and feed on cue and every 3 hours.  And then to pump after the breastfeedings  That are somewhere around the 2-3 hour mark.Urged parents to pump right after/and/or within the hour past breastfeeding. Discussed possibilty of not being able to pump much as night due to cluster feeding.  Mom asked for more comfort gels.  Gave her another pack of comfort gels, but encourged her to follow up with lactation if still experiencing soreness or worsening soreness at 1 week.  Urged to gently splash warm water on nipples to remove any gel residue before feeding. Mom reports using 16 mm nipple shield.  Discussed with mom how her nipples may temporarily enlarge with breastfeeding/pumping and that she may need to move up to 20mm nipple shield.  Parents not sure if they are going home today or tomorrow. Urged mom to call for feeding observation.  Will follow up as needed.    Feeding Feeding Type: Breast Fed  LATCH Score                   Interventions    Lactation Tools Discussed/Used     Consult Status      Luz Mares [name] 05/23/2018, 12:07 PM

## 2018-05-23 NOTE — Lactation Note (Addendum)
This note was copied from a baby's chart. Lactation Consultation Note  Patient Name: Carmen Gibbs [id number]'[id number] Date: 05/23/2018 P1, 44 hour female infant, ETI Per mom, nipple sores and infant is cluster feeding and she is very tired. LC ask mom do nipple roll and stretch NS before apply to breast and latching infant. Mom latched infant on right  breast using cross cradle hold, reinforce mom  Infant's "nose to breast" and top lip flange and lower jaw extended. LC discussed deep latch,  audible swallowing was heard and colostrum was present in NS as mom broke infant latch. Mom breastfeed infant on her right side and used manual hand pump on left side while breastfeeding,  Mom expressed 5 ml that was feed by curve tip syringe. Mom BF for 20 minutes. LC discussed with parents how to wake sleepy baby at breast.  LC explain how to use comfort gels to help soothe breast soreness. If mom has any further questions or concerns she will ask Nurse or LC.   Maternal Data    Feeding    LATCH Score Latch: Grasps breast easily, tongue down, lips flanged, rhythmical sucking.  Audible Swallowing: Spontaneous and intermittent  Type of Nipple: Everted at rest and after stimulation(short shafted)  Comfort (Breast/Nipple): Soft / non-tender  Hold (Positioning): Assistance needed to correctly position infant at breast and maintain latch.  LATCH Score: 9  Interventions Interventions: Assisted with latch;Support pillows;Position options;Adjust position;Breast compression;Comfort gels  Lactation Tools Discussed/Used Tools: Comfort gels;Nipple Dorris Carnes;Other (comment)(curve tip syringe) Nipple shield size: 16   Consult Status      Majid Mccravy 05/23/2018, 2:30 AM

## 2018-05-26 ENCOUNTER — Encounter (HOSPITAL_COMMUNITY)
Admission: RE | Admit: 2018-05-26 | Discharge: 2018-05-26 | Disposition: A | Discharge status: Home / Self Care | Payer: 59 | Source: Ambulatory Visit | Attending: Obstetrics and Gynecology | Admitting: Obstetrics and Gynecology

## 2018-05-27 ENCOUNTER — Inpatient Hospital Stay (HOSPITAL_COMMUNITY): Admission: RE | Admit: 2018-05-27 | Payer: 59 | Source: Ambulatory Visit | Admitting: Obstetrics and Gynecology

## 2019-04-20 DIAGNOSIS — K2950 Unspecified chronic gastritis without bleeding: Secondary | ICD-10-CM | POA: Insufficient documentation

## 2019-04-22 ENCOUNTER — Emergency Department: Payer: BC Managed Care – PPO

## 2019-04-22 ENCOUNTER — Encounter: Payer: Self-pay | Admitting: Emergency Medicine

## 2019-04-22 ENCOUNTER — Emergency Department
Admission: EM | Admit: 2019-04-22 | Discharge: 2019-04-22 | Disposition: A | Discharge status: Home / Self Care | Payer: BC Managed Care – PPO | Attending: Student | Admitting: Student

## 2019-04-22 ENCOUNTER — Other Ambulatory Visit: Payer: Self-pay

## 2019-04-22 DIAGNOSIS — Z91040 Latex allergy status: Secondary | ICD-10-CM | POA: Diagnosis not present

## 2019-04-22 DIAGNOSIS — F17210 Nicotine dependence, cigarettes, uncomplicated: Secondary | ICD-10-CM | POA: Insufficient documentation

## 2019-04-22 DIAGNOSIS — Z79899 Other long term (current) drug therapy: Secondary | ICD-10-CM | POA: Diagnosis not present

## 2019-04-22 DIAGNOSIS — M5489 Other dorsalgia: Secondary | ICD-10-CM | POA: Diagnosis not present

## 2019-04-22 DIAGNOSIS — K219 Gastro-esophageal reflux disease without esophagitis: Secondary | ICD-10-CM | POA: Insufficient documentation

## 2019-04-22 DIAGNOSIS — R1010 Upper abdominal pain, unspecified: Secondary | ICD-10-CM

## 2019-04-22 DIAGNOSIS — M549 Dorsalgia, unspecified: Secondary | ICD-10-CM

## 2019-04-22 DIAGNOSIS — Z3202 Encounter for pregnancy test, result negative: Secondary | ICD-10-CM | POA: Diagnosis not present

## 2019-04-22 DIAGNOSIS — Z8719 Personal history of other diseases of the digestive system: Secondary | ICD-10-CM

## 2019-04-22 LAB — CBC WITH DIFFERENTIAL/PLATELET
Abs Immature Granulocytes: 0.02 10*3/uL (ref 0.00–0.07)
Basophils Absolute: 0.1 10*3/uL (ref 0.0–0.1)
Basophils Relative: 1 %
Eosinophils Absolute: 0.1 10*3/uL (ref 0.0–0.5)
Eosinophils Relative: 1 %
HCT: 42.8 % (ref 36.0–46.0)
Hemoglobin: 14.2 g/dL (ref 12.0–15.0)
Immature Granulocytes: 0 %
Lymphocytes Relative: 28 %
Lymphs Abs: 2.4 10*3/uL (ref 0.7–4.0)
MCH: 29.3 pg (ref 26.0–34.0)
MCHC: 33.2 g/dL (ref 30.0–36.0)
MCV: 88.4 fL (ref 80.0–100.0)
Monocytes Absolute: 0.6 10*3/uL (ref 0.1–1.0)
Monocytes Relative: 7 %
Neutro Abs: 5.5 10*3/uL (ref 1.7–7.7)
Neutrophils Relative %: 63 %
Platelets: 301 10*3/uL (ref 150–400)
RBC: 4.84 MIL/uL (ref 3.87–5.11)
RDW: 12.5 % (ref 11.5–15.5)
WBC: 8.6 10*3/uL (ref 4.0–10.5)
nRBC: 0 % (ref 0.0–0.2)

## 2019-04-22 LAB — URINALYSIS, COMPLETE (UACMP) WITH MICROSCOPIC
Bacteria, UA: NONE SEEN
Bilirubin Urine: NEGATIVE
Glucose, UA: NEGATIVE mg/dL
Hgb urine dipstick: NEGATIVE
Ketones, ur: NEGATIVE mg/dL
Leukocytes,Ua: NEGATIVE
Nitrite: NEGATIVE
Protein, ur: NEGATIVE mg/dL
Specific Gravity, Urine: 1.019 (ref 1.005–1.030)
pH: 7 (ref 5.0–8.0)

## 2019-04-22 LAB — POCT PREGNANCY, URINE: Preg Test, Ur: NEGATIVE

## 2019-04-22 LAB — COMPREHENSIVE METABOLIC PANEL
ALT: 21 U/L (ref 0–44)
AST: 25 U/L (ref 15–41)
Albumin: 5 g/dL (ref 3.5–5.0)
Alkaline Phosphatase: 65 U/L (ref 38–126)
Anion gap: 8 (ref 5–15)
BUN: 13 mg/dL (ref 6–20)
CO2: 28 mmol/L (ref 22–32)
Calcium: 9.8 mg/dL (ref 8.9–10.3)
Chloride: 102 mmol/L (ref 98–111)
Creatinine, Ser: 0.57 mg/dL (ref 0.44–1.00)
GFR calc Af Amer: >60 mL/min (ref >60)
GFR calc non Af Amer: >60 mL/min (ref >60)
Glucose, Bld: 110 mg/dL — ABNORMAL HIGH (ref 70–99)
Potassium: 4.4 mmol/L (ref 3.5–5.1)
Sodium: 138 mmol/L (ref 135–145)
Total Bilirubin: 0.9 mg/dL (ref 0.3–1.2)
Total Protein: 8.4 g/dL — ABNORMAL HIGH (ref 6.5–8.1)

## 2019-04-22 LAB — LIPASE, BLOOD: Lipase: 29 U/L (ref 11–51)

## 2019-04-22 MED ORDER — SUCRALFATE 1 GM/10ML PO SUSP
1.0000 g | Freq: Once | ORAL | Status: AC
  Administered 2019-04-22: 1 g via ORAL
  Filled 2019-04-22: qty 10

## 2019-04-22 MED ORDER — KETOROLAC TROMETHAMINE 60 MG/2ML IM SOLN
60.0000 mg | Freq: Once | INTRAMUSCULAR | Status: AC
  Administered 2019-04-22: 60 mg via INTRAMUSCULAR
  Filled 2019-04-22: qty 2

## 2019-04-22 MED ORDER — PANTOPRAZOLE SODIUM 40 MG PO TBEC
40.0000 mg | DELAYED_RELEASE_TABLET | Freq: Every day | ORAL | Status: DC
  Administered 2019-04-22: 40 mg via ORAL
  Filled 2019-04-22: qty 1

## 2019-04-22 MED ORDER — ALUM & MAG HYDROXIDE-SIMETH 200-200-20 MG/5ML PO SUSP
30.0000 mL | Freq: Once | ORAL | Status: AC
  Administered 2019-04-22: 30 mL via ORAL
  Filled 2019-04-22: qty 30

## 2019-04-22 MED ORDER — OMEPRAZOLE 40 MG PO CPDR: 40.0000 mg | DELAYED_RELEASE_CAPSULE | Freq: Every day | ORAL | 0 refills | Status: AC

## 2019-04-22 MED ORDER — LIDOCAINE VISCOUS HCL 2 % MT SOLN
15.0000 mL | Freq: Once | OROMUCOSAL | Status: AC
  Administered 2019-04-22: 15 mL via ORAL
  Filled 2019-04-22: qty 15

## 2019-04-22 MED ORDER — HYDROMORPHONE HCL 1 MG/ML IJ SOLN
1.0000 mg | Freq: Once | INTRAMUSCULAR | Status: AC
  Administered 2019-04-22: 1 mg via INTRAMUSCULAR
  Filled 2019-04-22: qty 1

## 2019-04-22 MED ORDER — ORPHENADRINE CITRATE 30 MG/ML IJ SOLN
60.0000 mg | Freq: Two times a day (BID) | INTRAMUSCULAR | Status: DC
  Administered 2019-04-22: 60 mg via INTRAMUSCULAR
  Filled 2019-04-22: qty 2

## 2019-04-22 NOTE — ED Notes (Signed)
States pain is returning in abd  Back pain has eased off  Pt moved to room 25 via stretcher

## 2019-04-22 NOTE — Discharge Instructions (Addendum)
Thank you for letting us take care of you in the emergency department today.   Please continue to take any regular, prescribed medications. Avoid ibuprofen because it can worsen gastritis. For your back pain, use Tylenol and lidocaine patches (you can find over the counter under the name Salon Pas)  New medications we have prescribed:  - Omeprazole - take daily as directed.  Please follow up with: - Your primary care doctor to review your ER visit and follow up on your symptoms. Touch base with them about following up with a GI doctor. Information for one is below as needed.  Please return to the ER for any new or worsening symptoms.

## 2019-04-22 NOTE — ED Triage Notes (Signed)
PT arrives with complaints of upper back pain from a "wrong movement" this morning at 0715. PT reports chronic back pain but "this is worse."

## 2019-04-22 NOTE — ED Notes (Signed)
Report called to [facility]   Informed pt of of move   States pain has eased off  Wishes to wait

## 2019-04-22 NOTE — ED Notes (Signed)
See triage note  Presents with pain to left mid back this am  States she bent down to pick up something

## 2019-04-22 NOTE — ED Notes (Signed)
Pt given crackers and peanut butter at this time for PO challenge. Pt given ginger ale to drink.

## 2019-04-22 NOTE — ED Notes (Signed)
Returns from x-ray  Tearful  States while she was in x-ray she developed pain under right breast area

## 2019-04-22 NOTE — ED Provider Notes (Signed)
[facility] Emergency Department Provider Note   ____________________________________________   First MD Initiated Contact with Patient 04/22/19 0827     (approximate)  I have reviewed the triage vital signs and the nursing notes.   HISTORY  Chief Complaint Back Pain    HPI Denese KillingsBrandi Jade Belger is a 33 y.o. female patient complain acute onset of upper back pain secondary to a flexion motion this morning.  Patient has a history of chronic back pain secondary to scoliosis.  Patient said this pain is worse than what she normally experiences.  Patient described the pain as "sharp".  Rates pain as 8/10.  Patient stated pain is mostly on her left side.  Patient also stated radicular component to the left lower leg when she stands up.  Patient stated she gives most her relief by standing and a fetal position in the bed.  No other palliative measures for complaint.  Patient also complaining of lower center at upper abdomen pain.  Patient describes the pain as "sharp".     Past Medical History:  Diagnosis Date   Allergy    GERD (gastroesophageal reflux disease)    History of PCOS    Mental disorder    bipolar    Scoliosis     Patient Active Problem List   Diagnosis Date Noted   S/P cesarean section 05/21/2018   Breech presentation 05/21/2018   Bipolar 1 disorder, depressed, severe (HCC) 01/03/2016   Positive serology for Helicobacter pylori 08/08/2012   BMI 28.0-28.9,adult 08/01/2012   Scoliosis 08/01/2012    Past Surgical History:  Procedure Laterality Date   CESAREAN SECTION N/A 05/21/2018   Procedure: CESAREAN SECTION;  Surgeon: Marcelle OverlieGrewal, Michelle, MD;  [facility];  Service: Obstetrics;  Laterality: N/A;  Primary edc 06/01/18 Odelia GageHeather K, RNFA   CHOLECYSTECTOMY     WISDOM TOOTH EXTRACTION      Prior to Admission medications   Medication Sig Start Date End Date Taking? Authorizing Provider  CALCIUM PO Take 1 tablet by  mouth at bedtime.     [provider]  ibuprofen (ADVIL,MOTRIN) 600 MG tablet Take 1 tablet (600 mg total) by mouth every 6 (six) hours. 05/23/18   Zelphia CairoAdkins, Gretchen, MD  levocetirizine (XYZAL) 5 MG tablet Take 5 mg by mouth every evening.    [provider]    Allergies Ciprofloxacin, Nicotine, and Latex  Family History  Problem Relation Age of Onset   Diabetes Mother    Basal cell carcinoma Mother    Hypertension Father    Bladder Cancer Father    Breast cancer Paternal Grandmother     Social History Social History   Tobacco Use   Smoking status: Current Every Day Smoker    Packs/day: 0.50    Types: Cigarettes   Smokeless tobacco: Never Used  Substance Use Topics   Alcohol use: Not Currently    Alcohol/week: 4.0 standard drinks    Types: 2 Glasses of wine, 2 Cans of beer per week   Drug use: No    Review of Systems  Constitutional: No fever/chills Eyes: No visual changes. ENT: No sore throat. Cardiovascular: Denies chest pain. Respiratory: Denies shortness of breath. Gastrointestinal: No abdominal pain.  No nausea, no vomiting.  No diarrhea.  No constipation. Genitourinary: Negative for dysuria. Musculoskeletal: Positive for back pain. Skin: Negative for rash. Neurological: Negative for headaches, focal weakness or numbness. Psychiatric:  Bipolar. Allergic/Immunilogical: Cipro, nicotine, and latex. ____________________________________________   PHYSICAL EXAM:  VITAL SIGNS: ED Triage  Vitals  Enc Vitals Group     BP 04/22/19 0817 111/67     Pulse Rate 04/22/19 0817 75     Resp 04/22/19 0817 17     Temp 04/22/19 0817 98.3 F (36.8 C)     Temp Source 04/22/19 0817 Oral     SpO2 04/22/19 0817 100 %     Weight 04/22/19 0816 199 lb (90.3 kg)     Height 04/22/19 0816 5\' 10"  (1.778 m)     Head Circumference --      Peak Flow --      Pain Score 04/22/19 0816 7     Pain Loc --      Pain Edu? --      Excl. in [place]? --     Constitutional: Alert and oriented.  Moderate distress.   Neck: No cervical spine tenderness to palpation. Cardiovascular: Normal rate, regular rhythm. Grossly normal heart sounds.  Good peripheral circulation. Respiratory: Normal respiratory effort.  No retractions. Lungs CTAB. Gastrointestinal: Soft and nontender. No distention. No abdominal bruits. No CVA tenderness. Genitourinary: Deferred **}Musculoskeletal: No lower extremity tenderness nor edema.  No joint effusions. Neurologic:  Normal speech and language. No gross focal neurologic deficits are appreciated. No gait instability. Skin:  Skin is warm, dry and intact. No rash noted. Psychiatric: Mood and affect are normal. Speech and behavior are normal.  ____________________________________________   LABS (all labs ordered are listed, but only abnormal results are displayed)  Labs Reviewed  CBC WITH DIFFERENTIAL/PLATELET  COMPREHENSIVE METABOLIC PANEL  URINALYSIS, COMPLETE (UACMP) WITH MICROSCOPIC  LIPASE, BLOOD  POC URINE PREG, ED  POCT PREGNANCY, URINE   ____________________________________________  EKG   ____________________________________________  RADIOLOGY  ED MD interpretation:    Official radiology report(s): Dg Thoracic Spine 2 View  Result Date: 04/22/2019 CLINICAL DATA:  Acute onset back pain EXAM: THORACIC SPINE 2 VIEWS; LUMBAR SPINE - 2-3 VIEW COMPARISON:  None. FINDINGS: No fracture or dislocation of the thoracic spine. Gentle dextroscoliosis of the thoracolumbar spine, apex approximately T9. There is mild disc space height loss and osteophytosis of the midthoracic spine. Vertebral body heights are preserved. The partially imaged chest is unremarkable. No fracture or dislocation of the lumbar spine. Disc spaces and vertebral body heights are preserved. Nonobstructive pattern of overlying bowel gas. IMPRESSION: 1. No fracture or dislocation of the thoracic spine. Mild multilevel disc space height loss and  osteophytosis of the midthoracic spine. 2. Gentle dextroscoliosis of the thoracolumbar spine, apex approximately T9. 3. No fracture or dislocation of the lumbar spine. Disc spaces and vertebral body heights are preserved. Electronically Signed   By: Eddie Candle M.D.   On: 04/22/2019 09:55   Dg Lumbar Spine 2-3 Views  Result Date: 04/22/2019 CLINICAL DATA:  Acute onset back pain EXAM: THORACIC SPINE 2 VIEWS; LUMBAR SPINE - 2-3 VIEW COMPARISON:  None. FINDINGS: No fracture or dislocation of the thoracic spine. Gentle dextroscoliosis of the thoracolumbar spine, apex approximately T9. There is mild disc space height loss and osteophytosis of the midthoracic spine. Vertebral body heights are preserved. The partially imaged chest is unremarkable. No fracture or dislocation of the lumbar spine. Disc spaces and vertebral body heights are preserved. Nonobstructive pattern of overlying bowel gas. IMPRESSION: 1. No fracture or dislocation of the thoracic spine. Mild multilevel disc space height loss and osteophytosis of the midthoracic spine. 2. Gentle dextroscoliosis of the thoracolumbar spine, apex approximately T9. 3. No fracture or dislocation of the lumbar spine. Disc spaces and vertebral body heights are  preserved. Electronically Signed   By: Lauralyn Primes M.D.   On: 04/22/2019 09:55    ____________________________________________   PROCEDURES  Procedure(s) performed (including Critical Care):  Procedures   ____________________________________________   INITIAL IMPRESSION / ASSESSMENT AND PLAN / ED COURSE  As part of my medical decision making, I reviewed the following data within the electronic MEDICAL RECORD NUMBER         Violette Morneault was evaluated in Emergency Department on 04/22/2019 for the symptoms described in the history of present illness. She was evaluated in the context of the global COVID-19 pandemic, which necessitated consideration that the patient might be at risk for infection  with the SARS-CoV-2 virus that causes COVID-19. Institutional protocols and algorithms that pertain to the evaluation of patients at risk for COVID-19 are in a state of rapid change based on information released by regulatory bodies including the CDC and federal and state organizations. These policies and algorithms were followed during the patient's care in the ED.  Patient presents with upper back and center lower chest wall pain.  Patient onset was secondary to a flexion movement.  Physical exam was remarkable only for guarding with palpation at approximately T8-T10.  Patient had only transient relief with Maalox and viscous lidocaine.  Gust x-ray findings with patient was grossly unremarkable except for degenerative changes in previous diagnosis of scoliosis of the thoracolumbar spine.  Patient will be sent to major side of the ED for further evaluation by Dr. Mort Sawyers.       ____________________________________________   FINAL CLINICAL IMPRESSION(S) / ED DIAGNOSES  Final diagnoses:  Acute upper back pain  Pain of upper abdomen     ED Discharge Orders    None       Note:  This document was prepared using Dragon voice recognition software and may include unintentional dictation errors.    Joni Reining, PA-C 04/22/19 1036    Miguel Aschoff., MD 04/23/19 [phone]

## 2019-05-01 NOTE — Progress Notes (Signed)
Hi I dont believe I know this patient   Dr Jonathon Bellows MD,MRCP [facility]) Gastroenterology/Hepatology Pager: [phone]

## 2019-08-19 LAB — H PYLORI, IGM, IGG, IGA AB
H Pylori IgG: 0.85 Index Value — ABNORMAL HIGH (ref 0.00–0.79)
H. Pylogi, Iga Abs: <9 units (ref 0.0–8.9)
H. Pylogi, Igm Abs: <9 units (ref 0.0–8.9)

## 2019-09-05 ENCOUNTER — Other Ambulatory Visit: Payer: Self-pay | Admitting: Infectious Diseases

## 2019-09-05 ENCOUNTER — Other Ambulatory Visit: Payer: Self-pay

## 2019-09-05 ENCOUNTER — Ambulatory Visit
Admission: RE | Admit: 2019-09-05 | Discharge: 2019-09-05 | Disposition: A | Discharge status: Home / Self Care | Payer: BC Managed Care – PPO | Source: Ambulatory Visit | Attending: Infectious Diseases | Admitting: Infectious Diseases

## 2019-09-05 DIAGNOSIS — I82812 Embolism and thrombosis of superficial veins of left lower extremities: Secondary | ICD-10-CM

## 2019-09-05 DIAGNOSIS — M79605 Pain in left leg: Secondary | ICD-10-CM

## 2019-09-12 ENCOUNTER — Encounter (INDEPENDENT_AMBULATORY_CARE_PROVIDER_SITE_OTHER): Payer: Self-pay | Admitting: Vascular Surgery

## 2019-09-12 ENCOUNTER — Ambulatory Visit (INDEPENDENT_AMBULATORY_CARE_PROVIDER_SITE_OTHER): Payer: BC Managed Care – PPO | Admitting: Vascular Surgery

## 2019-09-12 ENCOUNTER — Other Ambulatory Visit: Payer: Self-pay

## 2019-09-12 VITALS — BP 131/87 | HR 81 | Resp 12 | Ht 71.0 in | Wt 200.0 lb

## 2019-09-12 DIAGNOSIS — K2950 Unspecified chronic gastritis without bleeding: Secondary | ICD-10-CM | POA: Diagnosis not present

## 2019-09-12 DIAGNOSIS — I8312 Varicose veins of left lower extremity with inflammation: Secondary | ICD-10-CM

## 2019-09-12 NOTE — Progress Notes (Signed)
MRN : [id number]  Carmen Gibbs is a 34 y.o. (19-Mar-1986) female who presents with chief complaint of  Chief Complaint  Patient presents with  . New Patient (Initial Visit)    LLE Thrombosis  .  History of Present Illness:   The patient is seen for evaluation of symptomatic varicose veins.  She was recently seen for evaluation of a painful knot over her left knee.  Examination as well as a duplex ultrasound were consistent with superficial thrombophlebitis of the varicosity at this location.  The patient relates burning and stinging which worsened in the evening.  She notes the left knee seems to be the worst and that it almost feels like "growing pains".    The symptoms are not improved with elevation.  At this point, the symptoms are persistent and severe enough that they're having a negative impact on lifestyle and are interfering with daily activities.  There is no history of DVT or PE  There is no history of ulceration or hemorrhage. The patient denies a significant family history of varicose veins. OB history: G1  The patient has not worn graduated compression in the past. At the present time the patient has not been using over-the-counter analgesics. There is no history of prior surgical intervention or sclerotherapy.    No outpatient medications have been marked as taking for the 09/12/19 encounter (Office Visit) with Gilda Crease, Latina Craver, MD.    Past Medical History:  Diagnosis Date  . Allergy   . GERD (gastroesophageal reflux disease)   . History of PCOS   . Mental disorder    bipolar   . Scoliosis     Past Surgical History:  Procedure Laterality Date  . CESAREAN SECTION N/A 05/21/2018   Procedure: CESAREAN SECTION;  Surgeon: Marcelle Overlie, MD;  Location: [facility] BIRTHING SUITES;  Service: Obstetrics;  Laterality: N/A;  Primary edc 06/01/18 Odelia Gage, RNFA  . CHOLECYSTECTOMY    . WISDOM TOOTH EXTRACTION      Social History Social History   Tobacco Use    . Smoking status: Current Every Day Smoker    Packs/day: 0.50    Types: Cigarettes  . Smokeless tobacco: Never Used  Substance Use Topics  . Alcohol use: Not Currently    Alcohol/week: 4.0 standard drinks    Types: 2 Glasses of wine, 2 Cans of beer per week  . Drug use: No    Family History Family History  Problem Relation Age of Onset  . Diabetes Mother   . Basal cell carcinoma Mother   . Hypertension Father   . Bladder Cancer Father   . Breast cancer Paternal Grandmother   No family history of bleeding/clotting disorders, porphyria or autoimmune disease   Allergies  Allergen Reactions  . Ciprofloxacin Hives  . Nicotine Other (See Comments)    Erythematic rash caused by Nicotine Patch  . Latex Swelling and Rash     REVIEW OF SYSTEMS (Negative unless checked)  Constitutional: [] Weight loss  [] Fever  [] Chills Cardiac: [] Chest pain   [] Chest pressure   [] Palpitations   [] Shortness of breath when laying flat   [] Shortness of breath with exertion. Vascular:  [] Pain in legs with walking   [x] Pain in legs at rest  [] History of DVT   [] Phlebitis   [] Swelling in legs   [] Varicose veins   [] Non-healing ulcers Pulmonary:   [] Uses home oxygen   [] Productive cough   [] Hemoptysis   [] Wheeze  [] COPD   [] Asthma Neurologic:  [] Dizziness   []   Seizures   [] History of stroke   [] History of TIA  [] Aphasia   [] Vissual changes   [] Weakness or numbness in arm   [] Weakness or numbness in leg Musculoskeletal:   [] Joint swelling   [x] Joint pain   [] Low back pain Hematologic:  [] Easy bruising  [] Easy bleeding   [] Hypercoagulable state   [] Anemic Gastrointestinal:  [] Diarrhea   [] Vomiting  [x] Gastroesophageal reflux/heartburn   [] Difficulty swallowing. Genitourinary:  [] Chronic kidney disease   [] Difficult urination  [] Frequent urination   [] Blood in urine Skin:  [] Rashes   [] Ulcers  Psychological:  [] History of anxiety   []  History of major depression.  Physical Examination  Vitals:   09/12/19  0949  BP: 131/87  Pulse: 81  Resp: 12  Weight: 200 lb (90.7 kg)  Height: 5\' 11"  (1.803 m)   Body mass index is 27.89 kg/m. Gen: WD/WN, NAD Head: [place]/AT, No temporalis wasting.  Ear/Nose/Throat: Hearing grossly intact, nares w/o erythema or drainage, poor dentition Eyes: PER, EOMI, sclera nonicteric.  Neck: Supple, no masses.  No bruit or JVD.  Pulmonary:  Good air movement, clear to auscultation bilaterally, no use of accessory muscles.  Cardiac: RRR, normal S1, S2, no Murmurs. Vascular: Large varicosities present extensively greater than 10 mm left knee.  Mild venous stasis changes to the legs bilaterally.  1+ soft pitting edema Gastrointestinal: soft, non-distended. No guarding/no peritoneal signs.  Musculoskeletal: M/S 5/5 throughout.  No deformity or atrophy.  Neurologic: CN 2-12 intact. Pain and light touch intact in extremities.  Symmetrical.  Speech is fluent. Motor exam as listed above. Psychiatric: Judgment intact, Mood & affect appropriate for pt's clinical situation. Dermatologic: No rashes or ulcers noted.  No changes consistent with cellulitis.   CBC Lab Results  Component Value Date   WBC 8.6 04/22/2019   HGB 14.2 04/22/2019   HCT 42.8 04/22/2019   MCV 88.4 04/22/2019   PLT 301 04/22/2019    BMET    Component Value Date/Time   NA 138 04/22/2019 1026   K 4.4 04/22/2019 1026   CL 102 04/22/2019 1026   CO2 28 04/22/2019 1026   GLUCOSE 110 (H) 04/22/2019 1026   BUN 13 04/22/2019 1026   CREATININE 0.57 04/22/2019 1026   CREATININE 0.61 02/14/2014 1954   CALCIUM 9.8 04/22/2019 1026   GFRNONAA >60 04/22/2019 1026   GFRAA >60 04/22/2019 1026   CrCl cannot be calculated (Patient's most recent lab result is older than the maximum 21 days allowed.).  COAG No results found for: INR, PROTIME  Radiology Venous Img Lower Unilateral Left (DVT)  Result Date: 09/05/2019 CLINICAL DATA:  Pain and swelling in the left lower extremity. EXAM: Left LOWER EXTREMITY  VENOUS DOPPLER ULTRASOUND TECHNIQUE: Gray-scale sonography with graded compression, as well as color Doppler and duplex ultrasound were performed to evaluate the lower extremity deep venous systems from the level of the common femoral vein and including the common femoral, femoral, profunda femoral, popliteal and calf veins including the posterior tibial, peroneal and gastrocnemius veins when visible. The superficial great saphenous vein was also interrogated. Spectral Doppler was utilized to evaluate flow at rest and with distal augmentation maneuvers in the common femoral, femoral and popliteal veins. COMPARISON:  None. FINDINGS: Contralateral Common Femoral Vein: Respiratory phasicity is normal and symmetric with the symptomatic side. No evidence of thrombus. Normal compressibility. Common Femoral Vein: No evidence of thrombus. Normal compressibility, respiratory phasicity and response to augmentation. Saphenofemoral Junction: No evidence of thrombus. Normal compressibility and flow on color Doppler imaging.  Profunda Femoral Vein: No evidence of thrombus. Normal compressibility and flow on color Doppler imaging. Femoral Vein: No evidence of thrombus. Normal compressibility, respiratory phasicity and response to augmentation. Popliteal Vein: No evidence of thrombus. Normal compressibility, respiratory phasicity and response to augmentation. Calf Veins: No evidence of thrombus. Normal compressibility and flow on color Doppler imaging. Superficial Great Saphenous Vein: No evidence of thrombus. Normal compressibility. Venous Reflux:  None. Other Findings: Thrombosed varicosities overlying the anterior aspect of the left knee consistent with superficial thrombophlebitis. IMPRESSION: No evidence of deep venous thrombosis. Superficial thrombophlebitis involving the anterior aspect of the knee. Electronically Signed   By: Marijo Sanes M.D.   On: 09/05/2019 17:07    Assessment/Plan 1. Varicose veins of left lower  extremity with inflammation  Recommend:  The patient has large symptomatic varicose veins that are painful and associated with swelling.  I have discussed with the patient regarding  varicose veins and why they cause symptoms.  Patient will begin wearing graduated compression stockings class 1 on a daily basis, beginning first thing in the morning and removing them in the evening. The patient is instructed specifically not to sleep in the stockings.    The patient  will also begin using over-the-counter analgesics such as Motrin 600 mg po TID to help control the symptoms.    In addition, behavioral modification including elevation during the day will be initiated.    Pending the results of these changes the  patient can be reevaluated in three months.   An  ultrasound of the venous system with a reflux portion will be obtained.  However, at this time she is also being evaluated for having a second child.  She is hopeful that she will be able to conceive in the near future.  Given this situation we have discussed the need for graduated compression pantyhose if indeed she does become pregnant and that she should wait to follow-up with me until after her delivery.  Patient agrees with this and will contact the office at her convenience in the future.   2. Chronic gastritis without bleeding, unspecified gastritis type Continue PPI as already ordered, this medication has been reviewed and there are no changes at this time.  Avoidence of caffeine and alcohol  Moderate elevation of the head of the bed     Hortencia Pilar, MD  09/12/2019 9:57 AM

## 2020-01-06 DIAGNOSIS — O09893 Supervision of other high risk pregnancies, third trimester: Secondary | ICD-10-CM

## 2020-01-09 LAB — OB RESULTS CONSOLE GC/CHLAMYDIA: Gonorrhea: NEGATIVE

## 2020-01-09 LAB — OB RESULTS CONSOLE HIV ANTIBODY (ROUTINE TESTING): HIV: NONREACTIVE

## 2020-01-09 LAB — OB RESULTS CONSOLE VARICELLA ZOSTER ANTIBODY, IGG: Varicella: IMMUNE

## 2020-01-09 LAB — OB RESULTS CONSOLE RUBELLA ANTIBODY, IGM: Rubella: IMMUNE

## 2020-01-09 LAB — OB RESULTS CONSOLE GBS: GBS: NEGATIVE

## 2020-01-09 LAB — OB RESULTS CONSOLE HEPATITIS B SURFACE ANTIGEN: Hepatitis B Surface Ag: NEGATIVE

## 2020-04-26 ENCOUNTER — Encounter: Payer: Self-pay | Admitting: Emergency Medicine

## 2020-04-26 ENCOUNTER — Emergency Department
Admission: EM | Admit: 2020-04-26 | Discharge: 2020-04-26 | Disposition: A | Discharge status: Home / Self Care | Payer: No Typology Code available for payment source | Attending: Student in an Organized Health Care Education/Training Program | Admitting: Student in an Organized Health Care Education/Training Program

## 2020-04-26 ENCOUNTER — Emergency Department: Payer: No Typology Code available for payment source

## 2020-04-26 ENCOUNTER — Other Ambulatory Visit: Payer: Self-pay

## 2020-04-26 DIAGNOSIS — O9A213 Injury, poisoning and certain other consequences of external causes complicating pregnancy, third trimester: Secondary | ICD-10-CM | POA: Insufficient documentation

## 2020-04-26 DIAGNOSIS — Y990 Civilian activity done for income or pay: Secondary | ICD-10-CM | POA: Insufficient documentation

## 2020-04-26 DIAGNOSIS — F17210 Nicotine dependence, cigarettes, uncomplicated: Secondary | ICD-10-CM | POA: Diagnosis not present

## 2020-04-26 DIAGNOSIS — S61012A Laceration without foreign body of left thumb without damage to nail, initial encounter: Secondary | ICD-10-CM | POA: Insufficient documentation

## 2020-04-26 DIAGNOSIS — S60222A Contusion of left hand, initial encounter: Secondary | ICD-10-CM | POA: Insufficient documentation

## 2020-04-26 DIAGNOSIS — Z91040 Latex allergy status: Secondary | ICD-10-CM | POA: Diagnosis not present

## 2020-04-26 DIAGNOSIS — W231XXA Caught, crushed, jammed, or pinched between stationary objects, initial encounter: Secondary | ICD-10-CM | POA: Diagnosis not present

## 2020-04-26 DIAGNOSIS — Z3A25 25 weeks gestation of pregnancy: Secondary | ICD-10-CM | POA: Insufficient documentation

## 2020-04-26 IMAGING — DX DG HAND COMPLETE 3+V*L*
3 series · 3 of 3 positions shown · non-contrast
Comparison: None.

CLINICAL DATA: Crush injury.  Pain to the thumb.

EXAM:
LEFT HAND - COMPLETE 3+ VIEW

[hand ap]
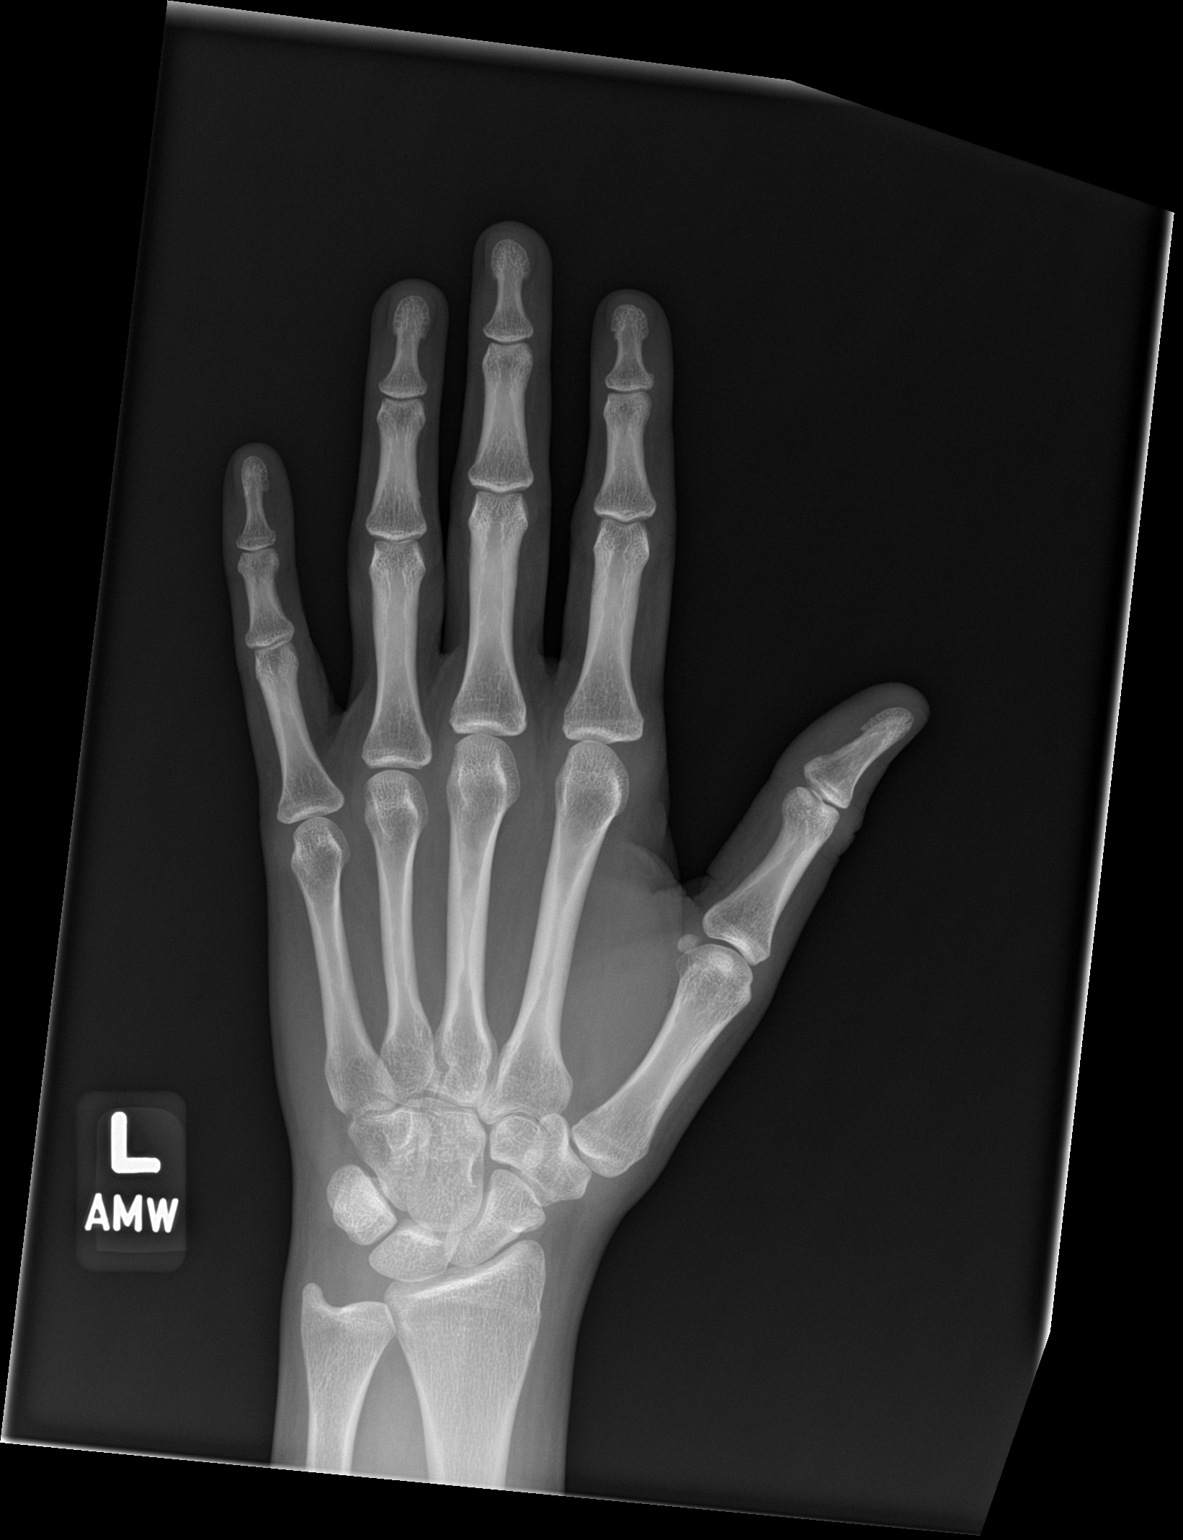

[hand obl]
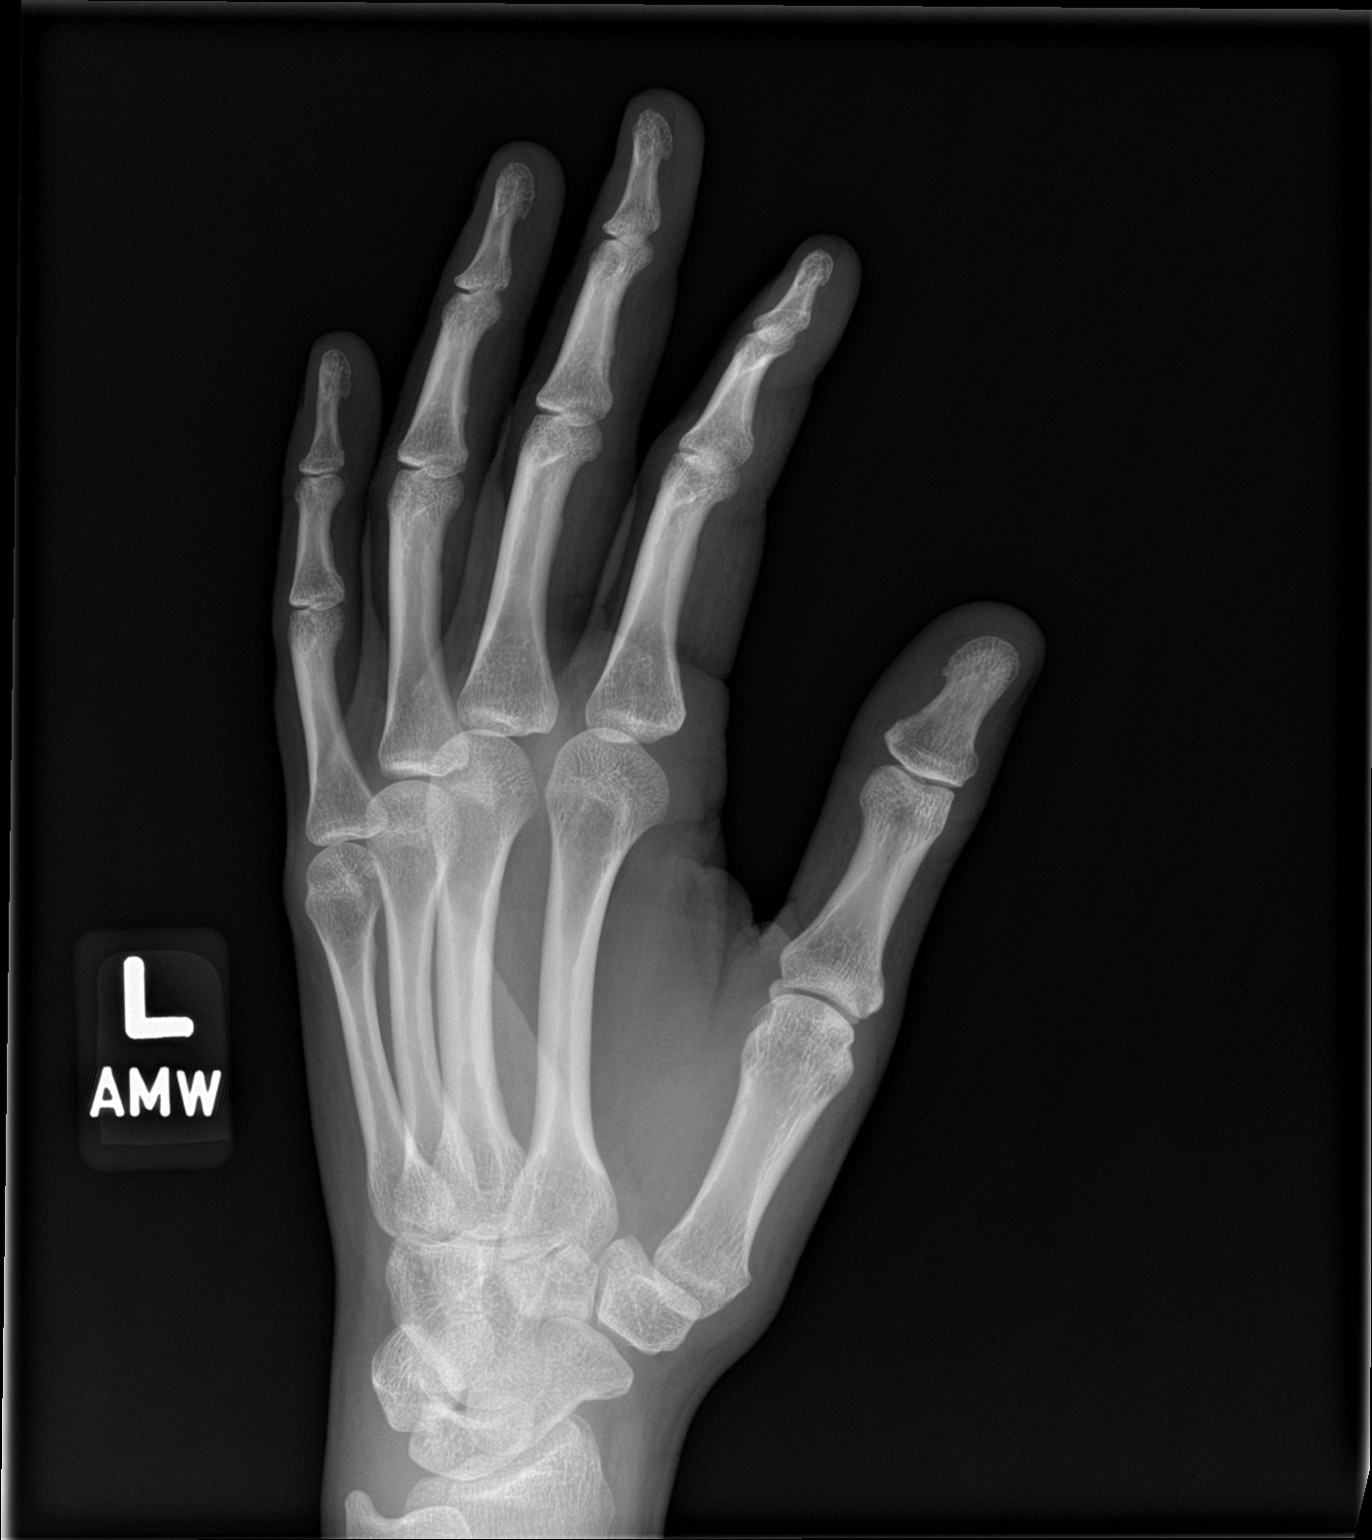

[hand lat]
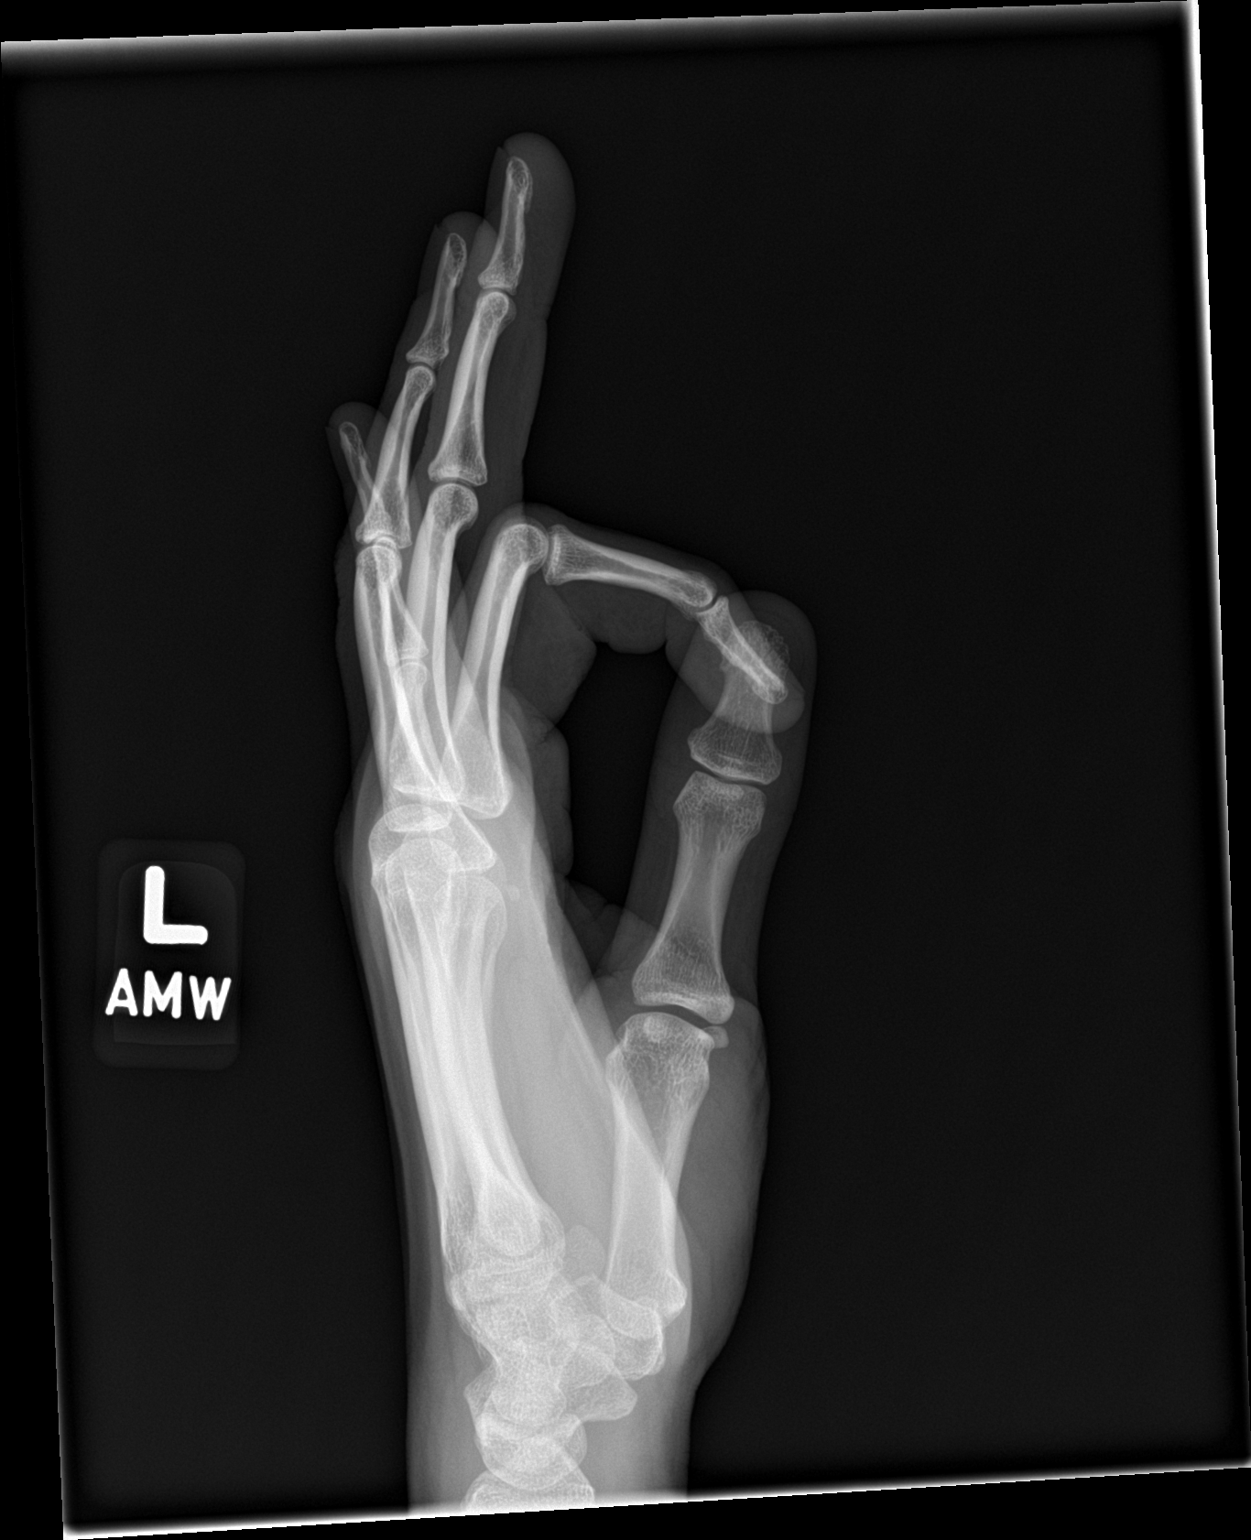

[3 of 3 positions shown; findings below may reference images not displayed]

FINDINGS: There is no evidence of fracture or dislocation. There is no
evidence of arthropathy or other focal bone abnormality. Soft
tissues are unremarkable.
IMPRESSION: Negative.

## 2020-04-26 MED ORDER — TETANUS-DIPHTH-ACELL PERTUSSIS 5-2.5-18.5 LF-MCG/0.5 IM SUSP: 0.5000 mL | Freq: Once | INTRAMUSCULAR | Status: DC

## 2020-04-26 NOTE — ED Provider Notes (Signed)
[facility] Emergency Department Provider Note ____________________________________________  Time seen: 1615  I have reviewed the triage vital signs and the nursing notes.  HISTORY  Chief Complaint  Hand Pain and Laceration  HPI Carmen Gibbs is a 34 y.o. female at [redacted] weeks gestation, presents to the ED for a work-related injury.  She reports a crush injury to her left hand and a laceration to the left thumb.  Patient with injury which occurred after her hand got pinched between a stack and a pallet jack.  She denies any other injury at this time.  She reports some intermittent sensation changes to the left hand, reporting that the hand feels numb.  No active bleeding is reported from the thumb laceration.   Past Medical History:  Diagnosis Date  . Allergy   . GERD (gastroesophageal reflux disease)   . History of PCOS   . Mental disorder    bipolar   . Scoliosis     Patient Active Problem List   Diagnosis Date Noted  . Varicose veins of left lower extremity with inflammation 09/12/2019  . Chronic gastritis without bleeding 04/20/2019  . S/P cesarean section 05/21/2018  . Breech presentation 05/21/2018  . Amenorrhea 10/23/2016  . Bipolar 1 disorder, depressed, severe (HCC) 01/03/2016  . Annual physical exam 08/13/2015  . Positive serology for Helicobacter pylori 08/08/2012  . BMI 28.0-28.9,adult 08/01/2012  . Scoliosis 08/01/2012    Past Surgical History:  Procedure Laterality Date  . CESAREAN SECTION N/A 05/21/2018   Procedure: CESAREAN SECTION;  Surgeon: Marcelle Overlie, MD;  Location: [facility] BIRTHING SUITES;  Service: Obstetrics;  Laterality: N/A;  Primary edc 06/01/18 Odelia Gage, RNFA  . CHOLECYSTECTOMY    . WISDOM TOOTH EXTRACTION      Prior to Admission medications   Medication Sig Start Date End Date Taking? Authorizing Provider  CALCIUM PO Take 1 tablet by mouth at bedtime.     [provider]  ibuprofen (ADVIL,MOTRIN) 600 MG  tablet Take 1 tablet (600 mg total) by mouth every 6 (six) hours. Patient not taking: Reported on 09/12/2019 05/23/18   Zelphia Cairo, MD  levocetirizine (XYZAL) 5 MG tablet Take 5 mg by mouth every evening.    [provider]  omeprazole (PRILOSEC) 40 MG capsule Take 1 capsule (40 mg total) by mouth daily. 04/22/19 05/22/19  Miguel Aschoff., MD    Allergies Ciprofloxacin, Nicotine, and Latex  Family History  Problem Relation Age of Onset  . Diabetes Mother   . Basal cell carcinoma Mother   . Hypertension Father   . Bladder Cancer Father   . Breast cancer Paternal Grandmother     Social History Social History   Tobacco Use  . Smoking status: Current Every Day Smoker    Packs/day: 0.50    Types: Cigarettes  . Smokeless tobacco: Never Used  Vaping Use  . Vaping Use: Never used  Substance Use Topics  . Alcohol use: Not Currently    Alcohol/week: 4.0 standard drinks    Types: 2 Glasses of wine, 2 Cans of beer per week  . Drug use: No    Review of Systems  Constitutional: Negative for fever. Respiratory: Negative for shortness of breath. Gastrointestinal: Negative for abdominal pain, vomiting and diarrhea. Musculoskeletal: Negative for back pain. Left hand pain & laceration as above.  Skin: Negative for rash. Neurological: Negative for headaches, focal weakness or numbness. ____________________________________________  PHYSICAL EXAM:  VITAL SIGNS: ED Triage Vitals  Enc Vitals Group  BP 04/26/20 1441 138/78     Pulse Rate 04/26/20 1441 97     Resp 04/26/20 1441 17     Temp 04/26/20 1441 98.6 F (37 C)     Temp Source 04/26/20 1441 Oral     SpO2 04/26/20 1441 100 %     Weight 04/26/20 1438 212 lb (96.2 kg)     Height 04/26/20 1438 5\' 11"  (1.803 m)     Head Circumference --      Peak Flow --      Pain Score 04/26/20 1438 5     Pain Loc --      Pain Edu? --      Excl. in GC? --     Constitutional: Alert and oriented. Well appearing and in no  distress. Head: Normocephalic and atraumatic. Eyes: Conjunctivae are normal. Normal extraocular movements Cardiovascular: Normal rate, regular rhythm. Normal distal pulses. Respiratory: Normal respiratory effort. No wheezes/rales/rhonchi. Musculoskeletal: Left hand with normal appearance. No deformity, abrasion, or edema. Normal composite fist. Left thumb with a semilunar laceration over the dorsal DIP. There is  Superficial linear laceration, in a longitudinal lie over the dorsal middle phalanx. No nail bed injury or avulsion noted. Nontender with normal range of motion in all extremities.  Neurologic:  Normal gross sensation. Normal speech and language. No gross focal neurologic deficits are appreciated. Skin:  Skin is warm, dry and intact. No rash noted. ____________________________________________   RADIOLOGY  DG Left  Hand  Negative ____________________________________________  PROCEDURES  .10/16/21Laceration Repair  Date/Time: 04/26/2020 4:33 PM Performed by: 04/28/2020, PA-C Authorized by: Lissa Hoard, PA-C   Consent:    Consent obtained:  Verbal   Consent given by:  Patient   Risks discussed:  Pain and poor wound healing   Alternatives discussed:  No treatment Anesthesia (see MAR for exact dosages):    Anesthesia method:  None Laceration details:    Location:  Finger   Finger location:  L thumb   Length (cm):  3   Depth (mm):  4 Repair type:    Repair type:  Simple Exploration:    Contaminated: no   Treatment:    Area cleansed with:  Soap and water   Amount of cleaning:  Standard Skin repair:    Repair method:  Tissue adhesive Approximation:    Approximation:  Close Post-procedure details:    Dressing:  Non-adherent dressing and splint for protection   Patient tolerance of procedure:  Tolerated well, no immediate complications   ____________________________________________  INITIAL IMPRESSION / ASSESSMENT AND PLAN / ED COURSE  Patient  with ED evaluation of a crush injury at work.  Patient presents with a linear laceration over the dorsal thumb and a small flap laceration over the dorsal DIP.  No radiologic evidence of a fracture or dislocation is noted.  Patient exam is overall benign with no indication of any neuromuscular deficit.  The superficial wound is cleansed and closed using wound adhesive.  The finger wound is appropriately covered and splint is applied for further protection.  Patient is given a work note for the remainder of today and tomorrow as requested.  She will follow-up with her companies provider for ongoing symptom management.  Hadleigh Felber Kawamoto was evaluated in Emergency Department on 04/26/2020 for the symptoms described in the history of present illness. She was evaluated in the context of the global COVID-19 pandemic, which necessitated consideration that the patient might be at risk for infection with the SARS-CoV-2  virus that causes COVID-19. Institutional protocols and algorithms that pertain to the evaluation of patients at risk for COVID-19 are in a state of rapid change based on information released by regulatory bodies including the CDC and federal and state organizations. These policies and algorithms were followed during the patient's care in the ED. ____________________________________________  FINAL CLINICAL IMPRESSION(S) / ED DIAGNOSES  Final diagnoses:  Laceration of left thumb without foreign body without damage to nail, initial encounter  Contusion of left hand, initial encounter      Lissa Hoard, PA-C 04/26/20 1708    Willy Eddy, MD 04/26/20 1725

## 2020-04-26 NOTE — ED Triage Notes (Signed)
Pt reports had her left hand crushed at work and also has a laceration to her left thumb. Pt reports left hand is numb.

## 2020-04-26 NOTE — ED Notes (Signed)
This Clinical research associate spoke with Carmen Gibbs in HR at [place] via telephone who declined the need for a urine drug screen.

## 2020-04-26 NOTE — Discharge Instructions (Signed)
Your exam and x-ray is negative for any acute fracture.  Your superficial thumb laceration has been repaired using wound adhesive.  Keep the wound clean, dry, and covered.  Avoid any lotions, creams, ointments, or oils over the glued wound.  Follow-up with your provider for any ongoing symptoms.

## 2020-06-13 ENCOUNTER — Other Ambulatory Visit: Payer: Self-pay

## 2020-06-13 ENCOUNTER — Observation Stay
Admission: EM | Admit: 2020-06-13 | Discharge: 2020-06-13 | Disposition: A | Discharge status: Home / Self Care | Payer: BC Managed Care – PPO | Attending: Obstetrics & Gynecology | Admitting: Obstetrics & Gynecology

## 2020-06-13 ENCOUNTER — Encounter: Payer: Self-pay | Admitting: Obstetrics & Gynecology

## 2020-06-13 DIAGNOSIS — R102 Pelvic and perineal pain: Secondary | ICD-10-CM | POA: Insufficient documentation

## 2020-06-13 DIAGNOSIS — Z3A33 33 weeks gestation of pregnancy: Secondary | ICD-10-CM | POA: Diagnosis not present

## 2020-06-13 LAB — WET PREP, GENITAL
Clue Cells Wet Prep HPF POC: NONE SEEN
Sperm: NONE SEEN
Trich, Wet Prep: NONE SEEN
Yeast Wet Prep HPF POC: NONE SEEN

## 2020-06-13 LAB — CBC
HCT: 34 % — ABNORMAL LOW (ref 36.0–46.0)
Hemoglobin: 11.6 g/dL — ABNORMAL LOW (ref 12.0–15.0)
MCH: 31.2 pg (ref 26.0–34.0)
MCHC: 34.1 g/dL (ref 30.0–36.0)
MCV: 91.4 fL (ref 80.0–100.0)
Platelets: 255 10*3/uL (ref 150–400)
RBC: 3.72 MIL/uL — ABNORMAL LOW (ref 3.87–5.11)
RDW: 14.7 % (ref 11.5–15.5)
WBC: 12.8 10*3/uL — ABNORMAL HIGH (ref 4.0–10.5)
nRBC: 0 % (ref 0.0–0.2)

## 2020-06-13 LAB — URINALYSIS, ROUTINE W REFLEX MICROSCOPIC
Bilirubin Urine: NEGATIVE
Glucose, UA: >=500 mg/dL — AB
Hgb urine dipstick: NEGATIVE
Ketones, ur: NEGATIVE mg/dL
Nitrite: NEGATIVE
Protein, ur: NEGATIVE mg/dL
Specific Gravity, Urine: 1.016 (ref 1.005–1.030)
pH: 7 (ref 5.0–8.0)

## 2020-06-13 LAB — TYPE AND SCREEN
ABO/RH(D): O NEG
Antibody Screen: NEGATIVE

## 2020-06-13 LAB — COMPREHENSIVE METABOLIC PANEL
ALT: 10 U/L (ref 0–44)
AST: 22 U/L (ref 15–41)
Albumin: 3.2 g/dL — ABNORMAL LOW (ref 3.5–5.0)
Alkaline Phosphatase: 101 U/L (ref 38–126)
Anion gap: 9 (ref 5–15)
BUN: 7 mg/dL (ref 6–20)
CO2: 22 mmol/L (ref 22–32)
Calcium: 9 mg/dL (ref 8.9–10.3)
Chloride: 105 mmol/L (ref 98–111)
Creatinine, Ser: 0.38 mg/dL — ABNORMAL LOW (ref 0.44–1.00)
GFR, Estimated: >60 mL/min (ref >60)
Glucose, Bld: 114 mg/dL — ABNORMAL HIGH (ref 70–99)
Potassium: 3.5 mmol/L (ref 3.5–5.1)
Sodium: 136 mmol/L (ref 135–145)
Total Bilirubin: 0.5 mg/dL (ref 0.3–1.2)
Total Protein: 6.8 g/dL (ref 6.5–8.1)

## 2020-06-13 LAB — CHLAMYDIA/NGC RT PCR (ARMC ONLY)
Chlamydia Tr: NOT DETECTED
N gonorrhoeae: NOT DETECTED

## 2020-06-13 MED ORDER — BETAMETHASONE SOD PHOS & ACET 6 (3-3) MG/ML IJ SUSP
INTRAMUSCULAR | Status: AC
  Administered 2020-06-13: 12 mg
  Filled 2020-06-13: qty 5

## 2020-06-13 MED ORDER — LACTATED RINGERS IV SOLN: Freq: Once | INTRAVENOUS | Status: AC

## 2020-06-13 MED ORDER — LORAZEPAM 2 MG/ML IJ SOLN: 0.5000 mg | Freq: Once | INTRAMUSCULAR | Status: AC

## 2020-06-13 MED ORDER — NIFEDIPINE 10 MG PO CAPS: 20.0000 mg | ORAL_CAPSULE | Freq: Four times a day (QID) | ORAL | Status: DC

## 2020-06-13 MED ORDER — TERBUTALINE SULFATE 1 MG/ML IJ SOLN
0.2500 mg | Freq: Once | INTRAMUSCULAR | Status: AC | PRN
  Administered 2020-06-13: 0.25 mg via SUBCUTANEOUS
  Filled 2020-06-13: qty 1

## 2020-06-13 MED ORDER — BETAMETHASONE SOD PHOS & ACET 6 (3-3) MG/ML IJ SUSP: 12.0000 mg | INTRAMUSCULAR | Status: DC

## 2020-06-13 MED ORDER — LORAZEPAM 2 MG/ML IJ SOLN
INTRAMUSCULAR | Status: AC
  Administered 2020-06-13: 0.5 mg via INTRAVENOUS
  Filled 2020-06-13: qty 1

## 2020-06-13 NOTE — Progress Notes (Signed)
Patient discharged home with her husband. AVS reviewed with patient and husband. Both state understanding and if/when they would need to call their doctor and come back to the hospital.

## 2020-06-13 NOTE — Progress Notes (Signed)
Dr. Elesa Massed was at bedside to evaluate sharp pain in the right lower abdomen and pt. Report of increasing pain with cxt. Dr. Elesa Massed did 2 bedside ultrasounds to evaluate uterine muscle wall both before and after pt urinated. Dr. Elesa Massed also found a tight muscle, potentially scar tissue right under skin in the same area patient is stating it is tender. She massaged it for over 10 minutes and the patient's pain drastically decreased. After done patient states her pain is 0 out of 10. Patient also had no contractions while Dr. Elesa Massed was in the room for over half hour. Dr. Elesa Massed gave orders to d/c IV and discharge patient. The patient is in agreement with the plan of care.

## 2020-06-13 NOTE — OB Triage Note (Signed)
1447 pt reports feeling a contraction followed by back pain and contractions q12-20 min. Pt denies vaginal bleeding, reports fluid but thinks it is urine. Reports good fetal movement but states his movements are bothersome to her uterine scar. Denies sex in last 24 hrs. EFM and toco applied and explained. Plan to monitor for preterm l;abor.

## 2020-06-13 NOTE — Progress Notes (Addendum)
At bedside.  Patient emotional.  As RN Apel documented states at 1447 she had a sharp pain that then radiated to her back and has had some sense of contractions since then, gradually becoming more frequent, up to 12 min apart.  She is concerned about preterm delivery.  Has hx of panic/anxiety.  Thinks she may be having a panic attack on top of this.   She developed a cough out of nowhere during this visit and says she is having difficulty catching her breath.  Previous LTCS for breech, closed in a single layer with chromic, term  GDMA1 Rh neg -rhogam not indicated (FOB AB neg) Latex allergy Scoliosis Fetal pylectasis - right - 0.63cm (stable) H/O bipolar - on zoloft and abilify Smoker  Exam:  Thin LUS anteriorly and posteriorly. Cervix posterior and splayed out circumferentially, but closed.  Unable to assess effacement of cervix itself.     -betamethasone given in case of imminent preterm delivery -terbutaline ordered but held due to maternal tachycardia.  Ordered procardia instead.   -ativan ordered for panic, and if resolves issues then terb can be given. -pulse ox applied  -if breathing concerns do not resolve with ativan admin, will consider nebulizer   Continuous monitoring. Will await results, but for now attempts to halt contractions are underway.  Must consider uterine scar separation, but pain with contractions only as of now.    ----- Ranae Plumber, MD, FACOG Attending Obstetrician and Gynecologist [facility], Department of OB/GYN [facility]

## 2020-06-13 NOTE — Discharge Summary (Signed)
Carmen Gibbs is a 34 y.o. female. She is at [redacted]w[redacted]d gestation. Patient's last menstrual period was 10/19/2019 (within days). Estimated Date of Delivery: 07/30/20  Prenatal care site: [facility]   Chief complaint: low abdominal pain and back pain  Location: just below cesarean scar, with low back pain Onset/timing: abrupt onset 1447 today Duration: ongoing since then Quality: sharp and intermittent Severity: moderate - severe Aggravating or alleviating conditions: feels contractions as well, the pain is worse with the contractions and then relieves in between. Associated signs/symptoms: no nausea, vomiting, recent trauma, illness, no VB.no LOF,  Active fetal movement Context: Patient was at work today, not doing anything in particular or having a heavy lifting or active day when she had a sudden onset pain in her low abdomen near the site of her cesarean scar.  She also felt worsening contractions, and began to have low back pain.  She reported to triage for evaluation.   Says she wasn't overdoing it today but may have on black Friday and the subsequent weekend.   Maternal Medical History:   Past Medical History:  Diagnosis Date  . Allergy   . GERD (gastroesophageal reflux disease)   . History of PCOS   . Mental disorder    bipolar   . Scoliosis     Past Surgical History:  Procedure Laterality Date  . CESAREAN SECTION N/A 05/21/2018   Procedure: CESAREAN SECTION;  Surgeon: Marcelle Overlie, MD;  Location: [facility] BIRTHING SUITES;  Service: Obstetrics;  Laterality: N/A;  Primary edc 06/01/18 Odelia Gage, RNFA  . CHOLECYSTECTOMY    . WISDOM TOOTH EXTRACTION      Allergies  Allergen Reactions  . Ciprofloxacin Hives  . Nicotine Other (See Comments)    Erythematic rash caused by Nicotine Patch  . Latex Swelling and Rash    Prior to Admission medications   Medication Sig Start Date End Date Taking? Authorizing Provider  ARIPiprazole (ABILIFY) 2 MG tablet Take 2 mg  by mouth daily.   Yes [provider]  loratadine (CLARITIN) 10 MG tablet Take 10 mg by mouth daily.   Yes [provider]  Prenatal Vit-Fe Fumarate-FA (PRENATAL MULTIVITAMIN) TABS tablet Take 1 tablet by mouth daily at 12 noon.   Yes [provider]  sertraline (ZOLOFT) 100 MG tablet Take 100 mg by mouth daily.   Yes [provider]  CALCIUM PO Take 1 tablet by mouth at bedtime.  Patient not taking: Reported on 06/13/2020    [provider]  ibuprofen (ADVIL,MOTRIN) 600 MG tablet Take 1 tablet (600 mg total) by mouth every 6 (six) hours. Patient not taking: Reported on 09/12/2019 05/23/18   Zelphia Cairo, MD  levocetirizine (XYZAL) 5 MG tablet Take 5 mg by mouth every evening. Patient not taking: Reported on 06/13/2020    [provider]  omeprazole (PRILOSEC) 40 MG capsule Take 1 capsule (40 mg total) by mouth daily. 04/22/19 05/22/19  Miguel Aschoff., MD     Social History: She  reports that she has been smoking cigarettes. She has been smoking about 0.50 packs per day. She has never used smokeless tobacco. She reports previous alcohol use of about 4.0 standard drinks of alcohol per week. She reports that she does not use drugs.  Family History: family history includes Basal cell carcinoma in her mother; Bladder Cancer in her father; Breast cancer in her paternal grandmother; Diabetes in her mother; Hypertension in her father.    Review of Systems: A full review of  systems was performed and negative except as noted in the HPI.     O:  BP 115/69   Pulse 82   Temp 99.5 F (37.5 C) (Oral)   Resp 20  Results for orders placed or performed during the hospital encounter of 06/13/20 (from the past 48 hour(s))  Urinalysis, Routine w reflex microscopic   Collection Time: 06/13/20  5:48 PM  Result Value Ref Range   Color, Urine YELLOW (A) YELLOW   APPearance TURBID (A) CLEAR   Specific Gravity, Urine 1.016 1.005 - 1.030   pH 7.0 5.0 - 8.0    Glucose, UA >=500 (A) NEGATIVE mg/dL   Hgb urine dipstick NEGATIVE NEGATIVE   Bilirubin Urine NEGATIVE NEGATIVE   Ketones, ur NEGATIVE NEGATIVE mg/dL   Protein, ur NEGATIVE NEGATIVE mg/dL   Nitrite NEGATIVE NEGATIVE   Leukocytes,Ua TRACE (A) NEGATIVE   RBC / HPF 0-5 0 - 5 RBC/hpf   WBC, UA 6-10 0 - 5 WBC/hpf   Bacteria, UA RARE (A) NONE SEEN   Squamous Epithelial / LPF 6-10 0 - 5  Comprehensive metabolic panel   Collection Time: 06/13/20  6:26 PM  Result Value Ref Range   Sodium 136 135 - 145 mmol/L   Potassium 3.5 3.5 - 5.1 mmol/L   Chloride 105 98 - 111 mmol/L   CO2 22 22 - 32 mmol/L   Glucose, Bld 114 (H) 70 - 99 mg/dL   BUN 7 6 - 20 mg/dL   Creatinine, Ser 2.72 (L) 0.44 - 1.00 mg/dL   Calcium 9.0 8.9 - 53.6 mg/dL   Total Protein 6.8 6.5 - 8.1 g/dL   Albumin 3.2 (L) 3.5 - 5.0 g/dL   AST 22 15 - 41 U/L   ALT 10 0 - 44 U/L   Alkaline Phosphatase 101 38 - 126 U/L   Total Bilirubin 0.5 0.3 - 1.2 mg/dL   GFR, Estimated >64 >40 mL/min   Anion gap 9 5 - 15  CBC on admission   Collection Time: 06/13/20  6:26 PM  Result Value Ref Range   WBC 12.8 (H) 4.0 - 10.5 K/uL   RBC 3.72 (L) 3.87 - 5.11 MIL/uL   Hemoglobin 11.6 (L) 12.0 - 15.0 g/dL   HCT 34.7 (L) 36 - 46 %   MCV 91.4 80.0 - 100.0 fL   MCH 31.2 26.0 - 34.0 pg   MCHC 34.1 30.0 - 36.0 g/dL   RDW 42.5 95.6 - 38.7 %   Platelets 255 150 - 400 K/uL   nRBC 0.0 0.0 - 0.2 %  Type and screen [facility]   Collection Time: 06/13/20  6:26 PM  Result Value Ref Range   ABO/RH(D) PENDING    Antibody Screen PENDING    Sample Expiration      06/16/2020,2359 Performed at [facility], [address] [place], [place] [address]   Type and screen   Collection Time: 06/13/20  7:09 PM  Result Value Ref Range   ABO/RH(D) O NEG    Antibody Screen NEG    Sample Expiration      06/16/2020,2359 Performed at [facility], [address] [place], [place] [address]      Constitutional: NAD,  AAOx3  HE/ENT: extraocular movements grossly intact, moist mucous membranes CV: RRR PULM: nl respiratory effort, CTABL     Abd: gravid, non-tender, non-distended, soft.       Ext: Non-tender, Nonedmeatous   Psych: mood appropriate, speech normal Pelvic:cervix closed.  LUS ant &  post thin.   On mons pubis, 2cm below cesarean scar and 2cm left of median there was a "knot" that was painful with palpation.  Trigger point palpation / manipulation applied with palpable release of tissue burden.  Spot less tender after two treatments.   NST:  Baseline: 140 Variability: moderate Accelerations present x >2 Decelerations absent Time  Bedside ultrasound shows intact but thin myometrium across cesarean scar and bladder. Anterior placenta distant from cervix, but over scar with no abnormalities.  Cephalic.  Ample fluid.    Assessment: 34 y.o. [redacted]w[redacted]d here for antenatal surveillance during pregnancy.  Principle diagnosis:  Preterm contractions, pelvic pain  Plan:  Labor: not present.   Fetal Wellbeing: Reassuring Cat 1 tracing.  Reactive NST   Terbutaline given and contractions subsided, but point tenderness / pain persisted.  Isolated superficially on mons pubis below cesarean scar - trigger point manipulation given with release and relief from patient.  Reassurance given and received.   D/c home stable, precautions reviewed, follow-up as scheduled.   ----- Ranae Plumber, MD Attending Obstetrician and Gynecologist [facility], Department of OB/GYN [facility]

## 2020-06-13 NOTE — OB Triage Note (Signed)
Patient was seen in L&D triage for contractions and lower abdomen pain. Patient was evaluated in person multiple times by [facility] doctor. She is now stating that she does not feel any more contractions and is ok to go home as recommended by the doctor. I educated her on signs of when to return including preterm labor precautions. Dr. Elesa Massed also went over that with her. Patient will follow up with her OB/GYN as scheduled and will be back tomorrow in L&D for her second dose of Betamethasone.

## 2020-06-14 ENCOUNTER — Observation Stay
Admission: EM | Admit: 2020-06-14 | Discharge: 2020-06-14 | Disposition: A | Discharge status: Home / Self Care | Payer: BC Managed Care – PPO | Attending: Obstetrics and Gynecology | Admitting: Obstetrics and Gynecology

## 2020-06-14 ENCOUNTER — Other Ambulatory Visit: Payer: Self-pay

## 2020-06-14 DIAGNOSIS — Z3A33 33 weeks gestation of pregnancy: Secondary | ICD-10-CM | POA: Insufficient documentation

## 2020-06-14 DIAGNOSIS — O99891 Other specified diseases and conditions complicating pregnancy: Secondary | ICD-10-CM | POA: Diagnosis present

## 2020-06-14 DIAGNOSIS — O99893 Other specified diseases and conditions complicating puerperium: Secondary | ICD-10-CM | POA: Diagnosis present

## 2020-06-14 DIAGNOSIS — M549 Dorsalgia, unspecified: Secondary | ICD-10-CM | POA: Insufficient documentation

## 2020-06-14 MED ORDER — CYCLOBENZAPRINE HCL 10 MG PO TABS
5.0000 mg | ORAL_TABLET | Freq: Three times a day (TID) | ORAL | Status: DC
  Administered 2020-06-14: 5 mg via ORAL
  Filled 2020-06-14: qty 0.5

## 2020-06-14 MED ORDER — BETAMETHASONE SOD PHOS & ACET 6 (3-3) MG/ML IJ SUSP
12.0000 mg | Freq: Once | INTRAMUSCULAR | Status: AC
  Administered 2020-06-14: 12 mg via INTRAMUSCULAR

## 2020-06-14 MED ORDER — ACETAMINOPHEN 500 MG PO TABS
1000.0000 mg | ORAL_TABLET | Freq: Four times a day (QID) | ORAL | Status: DC
  Administered 2020-06-14: 1000 mg via ORAL
  Filled 2020-06-14: qty 2

## 2020-06-14 NOTE — Discharge Summary (Signed)
Carmen Gibbs is a 34 y.o. female. She is at [redacted]w[redacted]d gestation. Patient's last menstrual period was 10/19/2019 (within days). Estimated Date of Delivery: 07/30/20  Prenatal care site: [facility]   Current pregnancy complicated by:  1. Gestational diabetes 2. Smoker, 1/2ppd 3. History of Depression and Bipolar disorder: consult with [place] [facility], Zoloft at 100mg , start Abilify 2mg  PO 4. 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 H/o previous c/s done for: maternal scoliosis,  baby was malposition; pt with traumatic birth experience.  6. Fetal pelviectasis right renal pelvis; 06/04/20 - right renal pelvis 0.63cm 7. Latex allergy 8. Scoliosis  Chief complaint: here for second BMZ injection, c/o ongoing severe back pain and being unable to sleep at night. Mild occasional contractions. Has soaked in the tub multiple times today with no relief.    S: Resting comfortably. no CTX, no VB.no LOF,  Active fetal movement. Denies: HA, visual changes, SOB, or RUQ/epigastric pain  Maternal Medical History:   Past Medical History:  Diagnosis Date  . Allergy   . GERD (gastroesophageal reflux disease)   . History of PCOS   . Mental disorder    bipolar   . Scoliosis     Past Surgical History:  Procedure Laterality Date  . CESAREAN SECTION N/A 05/21/2018   Procedure: CESAREAN SECTION;  Surgeon: 06/06/20, MD;  Location: [facility] BIRTHING SUITES;  Service: Obstetrics;  Laterality: N/A;  Primary edc 06/01/18 [facility], RNFA  . CHOLECYSTECTOMY    . WISDOM TOOTH EXTRACTION      Allergies  Allergen Reactions  . Ciprofloxacin Hives  . Nicotine Other (See Comments)    Erythematic rash caused by Nicotine Patch  . Latex Swelling and Rash    Prior to Admission medications   Medication Sig Start Date End Date Taking? Authorizing Provider  ARIPiprazole (ABILIFY) 2 MG tablet Take 2 mg by mouth daily.    [provider]  CALCIUM PO Take 1 tablet by mouth at bedtime.  Patient not taking: Reported on 06/13/2020    [provider]  ibuprofen (ADVIL,MOTRIN) 600 MG tablet Take 1 tablet (600 mg total) by mouth every 6 (six) hours. Patient not taking: Reported on 09/12/2019 05/23/18   11/12/2019, MD  levocetirizine (XYZAL) 5 MG tablet Take 5 mg by mouth every evening. Patient not taking: Reported on 06/13/2020    [provider]  loratadine (CLARITIN) 10 MG tablet Take 10 mg by mouth daily.    [provider]  omeprazole (PRILOSEC) 40 MG capsule Take 1 capsule (40 mg total) by mouth daily. 04/22/19 05/22/19  06/22/19., MD  Prenatal Vit-Fe Fumarate-FA (PRENATAL MULTIVITAMIN) TABS tablet Take 1 tablet by mouth daily at 12 noon.    [provider]  sertraline (ZOLOFT) 100 MG tablet Take 100 mg by mouth daily.    [provider]      Social History: She  reports that she has been smoking cigarettes. She has been smoking about 0.50 packs per day. She has never used smokeless tobacco. She reports previous alcohol use of about 4.0 standard drinks of alcohol per week. She reports that she does not use drugs.  Family History: family history includes Basal cell carcinoma in her mother; Bladder Cancer in her father; Breast cancer in her paternal grandmother; Diabetes in her mother; Hypertension in her father.   Review of Systems: A full review of  systems was performed and negative except as noted in the HPI.     O:  BP 118/61 (BP Location: Left Arm)   Pulse 80   Temp 98.5 F (36.9 C) (Oral)   Resp 16   LMP 10/19/2019 (Within Days)  Results for orders placed or performed during the hospital encounter of 06/13/20 (from the past 48 hour(s))  Urinalysis, Routine w reflex microscopic   Collection Time: 06/13/20  5:48 PM  Result Value Ref Range   Color, Urine YELLOW (A) YELLOW   APPearance TURBID (A) CLEAR   Specific Gravity, Urine 1.016 1.005 - 1.030    pH 7.0 5.0 - 8.0   Glucose, UA >=500 (A) NEGATIVE mg/dL   Hgb urine dipstick NEGATIVE NEGATIVE   Bilirubin Urine NEGATIVE NEGATIVE   Ketones, ur NEGATIVE NEGATIVE mg/dL   Protein, ur NEGATIVE NEGATIVE mg/dL   Nitrite NEGATIVE NEGATIVE   Leukocytes,Ua TRACE (A) NEGATIVE   RBC / HPF 0-5 0 - 5 RBC/hpf   WBC, UA 6-10 0 - 5 WBC/hpf   Bacteria, UA RARE (A) NONE SEEN   Squamous Epithelial / LPF 6-10 0 - 5  Comprehensive metabolic panel   Collection Time: 06/13/20  6:26 PM  Result Value Ref Range   Sodium 136 135 - 145 mmol/L   Potassium 3.5 3.5 - 5.1 mmol/L   Chloride 105 98 - 111 mmol/L   CO2 22 22 - 32 mmol/L   Glucose, Bld 114 (H) 70 - 99 mg/dL   BUN 7 6 - 20 mg/dL   Creatinine, Ser 2.42 (L) 0.44 - 1.00 mg/dL   Calcium 9.0 8.9 - 35.3 mg/dL   Total Protein 6.8 6.5 - 8.1 g/dL   Albumin 3.2 (L) 3.5 - 5.0 g/dL   AST 22 15 - 41 U/L   ALT 10 0 - 44 U/L   Alkaline Phosphatase 101 38 - 126 U/L   Total Bilirubin 0.5 0.3 - 1.2 mg/dL   GFR, Estimated >61 >44 mL/min   Anion gap 9 5 - 15  CBC on admission   Collection Time: 06/13/20  6:26 PM  Result Value Ref Range   WBC 12.8 (H) 4.0 - 10.5 K/uL   RBC 3.72 (L) 3.87 - 5.11 MIL/uL   Hemoglobin 11.6 (L) 12.0 - 15.0 g/dL   HCT 31.5 (L) 36 - 46 %   MCV 91.4 80.0 - 100.0 fL   MCH 31.2 26.0 - 34.0 pg   MCHC 34.1 30.0 - 36.0 g/dL   RDW 40.0 86.7 - 61.9 %   Platelets 255 150 - 400 K/uL   nRBC 0.0 0.0 - 0.2 %  Type and screen   Collection Time: 06/13/20  7:09 PM  Result Value Ref Range   ABO/RH(D) O NEG    Antibody Screen NEG    Sample Expiration      06/16/2020,2359 Performed at [facility], [address] [place], [place] [address]   Wet prep, genital   Collection Time: 06/13/20  8:41 PM  Result Value Ref Range   Yeast Wet Prep HPF POC NONE SEEN NONE SEEN   Trich, Wet Prep NONE SEEN NONE SEEN   Clue Cells Wet Prep HPF POC NONE SEEN NONE SEEN   WBC, Wet Prep HPF POC FEW (A) NONE SEEN   Sperm NONE SEEN   Chlamydia/NGC rt  PCR ([facility] only)   Collection Time: 06/13/20  8:42 PM   Specimen: Cervical/Vaginal swab  Result Value Ref Range   Specimen source GC/Chlam ENDOCERVICAL  Chlamydia Tr NOT DETECTED NOT DETECTED   N gonorrhoeae NOT DETECTED NOT DETECTED     Constitutional: NAD, AAOx3  HE/ENT: extraocular movements grossly intact, moist mucous membranes CV: RRR PULM: nl respiratory effort, CTABL     Abd: gravid, non-tender, non-distended, soft      Ext: Non-tender, Nonedematous   Psych: mood appropriate, speech normal Pelvic: SVE: closed, posterior.   Fetal  monitoring: Cat I Appropriate for GA Baseline:  Variability: moderate Accelerations:  present x >2 Decelerations absent TOCO:  Occasional  UCs, palpate mild.     A/P: 34 y.o. [redacted]w[redacted]d here for antenatal surveillance for back pain in pregnancy  Principle Diagnosis:  33wks, back pain in pregnancy   Preterm labor: not present. No cervical change.   Fetal Wellbeing: Reassuring Cat 1 tracing with reactive NST   Tylenol and Flexeril given with good relief. Flexeril sent to pharmacy on file  Discussed sleep hygiene, rx 5 tabs Ambien to pharmacy.   Given warnings that flexeril may cause sleepiness.   D/c home stable, precautions reviewed, follow-up as scheduled.    Randa Ngo, CNM 06/15/2020  12:18 AM

## 2020-06-14 NOTE — OB Triage Note (Signed)
Patient is here for her second dose of BMZ. She reports increased back pain and decreased fetal movement today. 6/10 on the pain scale with back pain and 4/5 with ctx pain

## 2020-06-15 LAB — CULTURE, BETA STREP (GROUP B ONLY)

## 2020-06-24 ENCOUNTER — Other Ambulatory Visit: Payer: Self-pay

## 2020-06-24 ENCOUNTER — Observation Stay
Admission: EM | Admit: 2020-06-24 | Discharge: 2020-06-25 | Disposition: A | Discharge status: Home / Self Care | Payer: BC Managed Care – PPO | Attending: Obstetrics and Gynecology | Admitting: Obstetrics and Gynecology

## 2020-06-24 ENCOUNTER — Observation Stay: Payer: BC Managed Care – PPO

## 2020-06-24 ENCOUNTER — Encounter: Payer: Self-pay | Admitting: Obstetrics and Gynecology

## 2020-06-24 DIAGNOSIS — O99333 Smoking (tobacco) complicating pregnancy, third trimester: Secondary | ICD-10-CM | POA: Insufficient documentation

## 2020-06-24 DIAGNOSIS — O26853 Spotting complicating pregnancy, third trimester: Secondary | ICD-10-CM | POA: Diagnosis not present

## 2020-06-24 DIAGNOSIS — F17200 Nicotine dependence, unspecified, uncomplicated: Secondary | ICD-10-CM | POA: Insufficient documentation

## 2020-06-24 DIAGNOSIS — O24419 Gestational diabetes mellitus in pregnancy, unspecified control: Secondary | ICD-10-CM | POA: Insufficient documentation

## 2020-06-24 DIAGNOSIS — Z91040 Latex allergy status: Secondary | ICD-10-CM | POA: Insufficient documentation

## 2020-06-24 DIAGNOSIS — Z3A34 34 weeks gestation of pregnancy: Secondary | ICD-10-CM | POA: Diagnosis not present

## 2020-06-24 DIAGNOSIS — O418X10 Other specified disorders of amniotic fluid and membranes, first trimester, not applicable or unspecified: Secondary | ICD-10-CM | POA: Diagnosis present

## 2020-06-24 DIAGNOSIS — O468X1 Other antepartum hemorrhage, first trimester: Secondary | ICD-10-CM | POA: Diagnosis present

## 2020-06-24 DIAGNOSIS — O4690 Antepartum hemorrhage, unspecified, unspecified trimester: Secondary | ICD-10-CM

## 2020-06-24 LAB — WET PREP, GENITAL
Clue Cells Wet Prep HPF POC: NONE SEEN
Sperm: NONE SEEN
Trich, Wet Prep: NONE SEEN
Yeast Wet Prep HPF POC: NONE SEEN

## 2020-06-24 LAB — RUPTURE OF MEMBRANE (ROM)PLUS: Rom Plus: NEGATIVE

## 2020-06-24 LAB — GLUCOSE, CAPILLARY
Glucose-Capillary: 106 mg/dL — ABNORMAL HIGH (ref 70–99)
Glucose-Capillary: 83 mg/dL (ref 70–99)

## 2020-06-24 MED ORDER — LIDOCAINE HCL (PF) 1 % IJ SOLN: 30.0000 mL | INTRAMUSCULAR | Status: DC | PRN

## 2020-06-24 MED ORDER — LACTATED RINGERS IV SOLN: INTRAVENOUS | Status: DC

## 2020-06-24 MED ORDER — TERBUTALINE SULFATE 1 MG/ML IJ SOLN
0.2500 mg | INTRAMUSCULAR | Status: DC | PRN
  Administered 2020-06-24: 0.25 mg via SUBCUTANEOUS
  Filled 2020-06-24: qty 1

## 2020-06-24 NOTE — Discharge Summary (Signed)
Patient ID: Carmen Gibbs MRN: [id number] DOB/AGE: September 27, 1985 34 y.o.  Admit date: 06/24/2020 Discharge date: 06/24/2020  Admission Diagnoses:  34yo G2P0 at [redacted]w[redacted]d by LMP.  Pt presented with VB around 1500. Bright red with clots then quickly tapered to brown, which continues during physical exam.  Reports noticing LOF a couple of days ago.  Pt reports occasional cramping. No recent IC. Reports GFM.  Recently worked up for PTL and given BMX x2 (12/1 and 12/2).    Pregnancy Complicated by: 1. Gestational diabetes 2. Smoker, 1/2ppd 3. History of Depression and Bipolar disorder: consult with [place] [facility], Zoloft at 100mg , start Abilify 2mg  PO 4. 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 H/o previous c/s done for: maternal scoliosis,  baby was malposition; pt with traumatic birth experience.  6. Fetal pelviectasis right renal pelvis; 06/04/20 - right renal pelvis 0.63cm 7. Latex allergy 8. Scoliosis  Discharge Diagnoses: PTL resolved  Prenatal Procedures: ultrasound CLINICAL DATA:  Vaginal bleeding during pregnancy. Third trimester pregnancy at 35 weeks 4 days to station by last menstrual period. EXAM: LIMITED OBSTETRIC ULTRASOUND FINDINGS: Number of Fetuses: 1 Heart Rate:  138 bpm Movement: Yes Presentation: Cephalic Placental Location: Anterior Previa: No Amniotic Fluid (Subjective):  Within normal limits. FL: 6.7 cm 34 w  3 d MATERNAL FINDINGS: Cervix:  Not well visualized due to advanced gestational age. Uterus/Adnexae: No abnormality visualized. IMPRESSION: Single live intrauterine pregnancy estimated gestational age [redacted] weeks 3 days based on femur length. Anterior placenta without previa.   Consults: None  Significant Diagnostic Studies:  Results for orders placed or performed during the hospital encounter of 06/24/20 (from the past 168 hour(s))  Glucose, capillary   Collection Time: 06/24/20   3:57 PM  Result Value Ref Range   Glucose-Capillary 106 (H) 70 - 99 mg/dL  Wet prep, genital   Collection Time: 06/24/20  4:34 PM   Specimen: Vaginal  Result Value Ref Range   Yeast Wet Prep HPF POC NONE SEEN NONE SEEN   Trich, Wet Prep NONE SEEN NONE SEEN   Clue Cells Wet Prep HPF POC NONE SEEN NONE SEEN   WBC, Wet Prep HPF POC MODERATE (A) NONE SEEN   Sperm NONE SEEN   ROM Plus ([facility] only)   Collection Time: 06/24/20  4:34 PM  Result Value Ref Range   Rom Plus NEGATIVE   Glucose, capillary   Collection Time: 06/24/20  7:16 PM  Result Value Ref Range   Glucose-Capillary 83 70 - 99 mg/dL    Treatments: IV hydration  Hospital Course:  IVF with 14/12/21 bolus Labs  Wet prep neg  ROM + neg SVE:   FT/90%/-3 posterior Moderate amount of blood-brown Ultrasound - see report above SVE:   FT/90/-3, posterior, small amount of blood, pt reports UCs - Toco 2-36min C/w Dr. - Continue IVF, Terb, could go home if UCs resolve and SVE remains unchanged. UCs resolved Discharged home stable    Discharge Physical Exam:  BP 139/84 (BP Location: Right Arm)   Pulse 77   Temp 98.1 F (36.7 C) (Oral)   Resp 17   Ht 5\' 11"  (1.803 m)   Wt 102.1 kg   LMP 10/19/2019 (Within Days)   BMI 31.38 kg/m   General: NAD CV: RRR Pulm: nl effort ABD: s/nd/nt, gravid DVT Evaluation: LE non-ttp, no evidence of DVT on exam.  NST: FHR baseline: 130 bpm Variability:  moderate Accelerations: yes Decelerations: none Time: 30+ minutes Category/reactivity: reactive   TOCO: quiet SVE:  Dilation: Closed Effacement (%): 90 Cervical Position: Posterior Station: Ballotable Exam by:: Malik Paar CNM   Discharge Condition: Stable  Disposition:  Discharge disposition: 01-Home or Self Care        Allergies as of 06/24/2020      Reactions   Ciprofloxacin Hives   Nicotine Other (See Comments)   Erythematic rash caused by Nicotine Patch   Latex Swelling, Rash      Medication List     TAKE these medications   ARIPiprazole 2 MG tablet Commonly known as: ABILIFY Take 2 mg by mouth daily.   CALCIUM PO Take 1 tablet by mouth at bedtime.   diazepam 2 MG tablet Commonly known as: VALIUM Take 2 mg by mouth every 6 (six) hours as needed for anxiety.   hydrOXYzine 50 MG tablet Commonly known as: ATARAX/VISTARIL Take 50 mg by mouth 3 (three) times daily as needed.   ibuprofen 600 MG tablet Commonly known as: ADVIL Take 1 tablet (600 mg total) by mouth every 6 (six) hours.   levocetirizine 5 MG tablet Commonly known as: XYZAL Take 5 mg by mouth every evening.   loratadine 10 MG tablet Commonly known as: CLARITIN Take 10 mg by mouth daily.   omeprazole 40 MG capsule Commonly known as: PriLOSEC Take 1 capsule (40 mg total) by mouth daily.   prenatal multivitamin Tabs tablet Take 1 tablet by mouth daily at 12 noon.   sertraline 100 MG tablet Commonly known as: ZOLOFT Take 100 mg by mouth daily.        SignedHaroldine Laws, CNM 06/24/2020 11:51 PM

## 2020-06-24 NOTE — OB Triage Note (Signed)
Pt. presents to L/D triage with reported vaginal bleeding that began 30 minutes ago. She noticed bright red bleeding with a few clots that tapered to "brown". Small brown discharge noted on arrival. She has also had intermittent cramping, rated 4/10, today that has been unrelieved by rest. Pt. reports positive fetal movement and no leaking of fluid. She reports feeling flushed, sweaty, and "not good". VSS. Monitors applied and assessing. Initial FHT 164.

## 2020-06-24 NOTE — Progress Notes (Signed)
Monitors removed for bedside ultrasound.

## 2020-06-25 DIAGNOSIS — O26853 Spotting complicating pregnancy, third trimester: Secondary | ICD-10-CM | POA: Diagnosis not present

## 2020-06-27 ENCOUNTER — Other Ambulatory Visit: Payer: Self-pay

## 2020-06-27 ENCOUNTER — Observation Stay
Admission: EM | Admit: 2020-06-27 | Discharge: 2020-06-27 | Disposition: A | Discharge status: Home / Self Care | Payer: BC Managed Care – PPO | Attending: Obstetrics & Gynecology | Admitting: Obstetrics & Gynecology

## 2020-06-27 ENCOUNTER — Encounter: Payer: Self-pay | Admitting: Obstetrics & Gynecology

## 2020-06-27 DIAGNOSIS — O99333 Smoking (tobacco) complicating pregnancy, third trimester: Secondary | ICD-10-CM | POA: Insufficient documentation

## 2020-06-27 DIAGNOSIS — O368131 Decreased fetal movements, third trimester, fetus 1: Secondary | ICD-10-CM | POA: Diagnosis present

## 2020-06-27 DIAGNOSIS — Z91040 Latex allergy status: Secondary | ICD-10-CM | POA: Diagnosis not present

## 2020-06-27 DIAGNOSIS — F17210 Nicotine dependence, cigarettes, uncomplicated: Secondary | ICD-10-CM | POA: Diagnosis not present

## 2020-06-27 DIAGNOSIS — Z3A35 35 weeks gestation of pregnancy: Secondary | ICD-10-CM | POA: Diagnosis not present

## 2020-06-27 LAB — URINALYSIS, ROUTINE W REFLEX MICROSCOPIC
Bilirubin Urine: NEGATIVE
Glucose, UA: 150 mg/dL — AB
Hgb urine dipstick: NEGATIVE
Ketones, ur: NEGATIVE mg/dL
Leukocytes,Ua: NEGATIVE
Nitrite: NEGATIVE
Protein, ur: NEGATIVE mg/dL
Specific Gravity, Urine: 1.023 (ref 1.005–1.030)
pH: 5 (ref 5.0–8.0)

## 2020-06-27 NOTE — OB Triage Note (Signed)
Patient arrived in triage with complaints of decreased fetal movement and abdominal pain. States pain has "been there", but it started getting worse again today. Pain is constant located general abdomen. Pt reports feeling baby move shortly after arrival to hospital, but she had not felt him move despite all interventions. Pt denies vaginal bleeding or leaking of fluid, does report some vaginal pressure. Monitors applied and assessing. Initial fetal heart tone in 170s.

## 2020-06-27 NOTE — OB Triage Note (Signed)
Patient given discharge instructions including follow up information, fetal kick count instructions, and labor precautions. Pt verbalized understanding. Patient discharged ambulatory in stable condition accompanied by significant other.

## 2020-06-27 NOTE — Discharge Summary (Signed)
Carmen Gibbs is a 34 y.o. female. She is at [redacted]w[redacted]d gestation. Patient's last menstrual period was 10/19/2019 (within days). Estimated Date of Delivery: 07/30/20  Prenatal care site: [facility]   Chief complaint: decreased fetal movement  Patient had decreased movement throughout the day, in spite of interventions.  She reported to triage, and upon admission felt normalization of fetal movement.  She has long history of anxiety and panic attack, and was having a panic attack upon admission.  Once fetal movement was detected, she normalized.   S: Resting comfortably. no CTX, no VB.no LOF,  Active fetal movement. States she has diffuse but left sided abdominal pain.  This has been chronic, and worsened some today.    Maternal Medical History:   Past Medical History:  Diagnosis Date  . Allergy   . GERD (gastroesophageal reflux disease)   . History of PCOS   . Mental disorder    bipolar   . Scoliosis     Past Surgical History:  Procedure Laterality Date  . CESAREAN SECTION N/A 05/21/2018   Procedure: CESAREAN SECTION;  Surgeon: Marcelle Overlie, MD;  Location: [facility] BIRTHING SUITES;  Service: Obstetrics;  Laterality: N/A;  Primary edc 06/01/18 Odelia Gage, RNFA  . CHOLECYSTECTOMY    . WISDOM TOOTH EXTRACTION      Allergies  Allergen Reactions  . Ciprofloxacin Hives  . Nicotine Other (See Comments)    Erythematic rash caused by Nicotine Patch  . Latex Swelling and Rash    Prior to Admission medications   Medication Sig Start Date End Date Taking? Authorizing Provider  ARIPiprazole (ABILIFY) 2 MG tablet Take 2 mg by mouth daily.   Yes [provider]  diazepam (VALIUM) 2 MG tablet Take 2 mg by mouth every 6 (six) hours as needed for anxiety.   Yes [provider]  hydrOXYzine (ATARAX/VISTARIL) 50 MG tablet Take 50 mg by mouth 3 (three) times daily as needed.   Yes [provider]  loratadine (CLARITIN) 10 MG tablet Take 10 mg by mouth  daily.   Yes [provider]  Prenatal Vit-Fe Fumarate-FA (PRENATAL MULTIVITAMIN) TABS tablet Take 1 tablet by mouth daily at 12 noon.   Yes [provider]  sertraline (ZOLOFT) 100 MG tablet Take 100 mg by mouth daily.   Yes [provider]  CALCIUM PO Take 1 tablet by mouth at bedtime.  Patient not taking: Reported on 06/13/2020    [provider]  ibuprofen (ADVIL,MOTRIN) 600 MG tablet Take 1 tablet (600 mg total) by mouth every 6 (six) hours. Patient not taking: Reported on 09/12/2019 05/23/18   Zelphia Cairo, MD  levocetirizine (XYZAL) 5 MG tablet Take 5 mg by mouth every evening. Patient not taking: Reported on 06/27/2020    [provider]  omeprazole (PRILOSEC) 40 MG capsule Take 1 capsule (40 mg total) by mouth daily. 04/22/19 05/22/19  Miguel Aschoff., MD     Social History: She  reports that she has been smoking cigarettes. She has been smoking about 0.50 packs per day. She has never used smokeless tobacco. She reports previous alcohol use of about 4.0 standard drinks of alcohol per week. She reports that she does not use drugs.  Family History: family history includes Basal cell carcinoma in her mother; Bladder Cancer in her father; Breast cancer in her paternal grandmother; Diabetes in her mother; Hypertension in her father.   Review of Systems: A full review of systems was performed and negative except as noted in  the HPI.     O:  BP 119/73 (BP Location: Left Arm)   Pulse 92   Temp 98.3 F (36.8 C) (Oral)   Resp 16   Ht 5\' 11"  (1.803 m)   Wt 99.8 kg   LMP 10/19/2019 (Within Days)   SpO2 99%   BMI 30.68 kg/m  Results for orders placed or performed during the hospital encounter of 06/27/20 (from the past 48 hour(s))  Urinalysis, Routine w reflex microscopic Urine, Clean Catch   Collection Time: 06/27/20  8:58 PM  Result Value Ref Range   Color, Urine YELLOW (A) YELLOW   APPearance HAZY (A) CLEAR   Specific Gravity, Urine  1.023 1.005 - 1.030   pH 5.0 5.0 - 8.0   Glucose, UA 150 (A) NEGATIVE mg/dL   Hgb urine dipstick NEGATIVE NEGATIVE   Bilirubin Urine NEGATIVE NEGATIVE   Ketones, ur NEGATIVE NEGATIVE mg/dL   Protein, ur NEGATIVE NEGATIVE mg/dL   Nitrite NEGATIVE NEGATIVE   Leukocytes,Ua NEGATIVE NEGATIVE     Constitutional: NAD, AAOx3  HE/ENT: extraocular movements grossly intact, moist mucous membranes CV: RRR PULM: nl respiratory effort, CTABL     Abd: gravid, non-tender, non-distended, soft (tender when patient observing palpation, non-tender with placement of doppler/toco) Ext: Non-tender, Nonedmeatous   Psych: mood appropriate, speech normal Pelvic deferred  NST:  Baseline: 145 Variability: moderate Accelerations present x >2 Decelerations absent Time 06/29/20  Toco: absent    Assessment: 33 y.o. [redacted]w[redacted]d here for antenatal surveillance during pregnancy.  Principle diagnosis: decreased fetal movement  Plan:  Labor: not present.   Fetal movement normalized  Fetal Wellbeing: Reassuring Cat 1 tracing.  Reactive NST   Abdominal pain, localized to left, chronic  D/c home stable, precautions reviewed, follow-up as scheduled.   ----- [redacted]w[redacted]d, MD Attending Obstetrician and Gynecologist [facility], Department of OB/GYN [facility]

## 2020-07-03 LAB — OB RESULTS CONSOLE RPR: RPR: NONREACTIVE

## 2020-07-03 LAB — OB RESULTS CONSOLE GBS: GBS: POSITIVE

## 2020-07-03 LAB — OB RESULTS CONSOLE HIV ANTIBODY (ROUTINE TESTING): HIV: NONREACTIVE

## 2020-07-13 ENCOUNTER — Encounter: Payer: Self-pay | Admitting: Obstetrics and Gynecology

## 2020-07-13 ENCOUNTER — Observation Stay
Admission: EM | Admit: 2020-07-13 | Discharge: 2020-07-13 | Disposition: A | Discharge status: Home / Self Care | Payer: BC Managed Care – PPO

## 2020-07-13 ENCOUNTER — Other Ambulatory Visit: Payer: Self-pay

## 2020-07-13 DIAGNOSIS — O99330 Smoking (tobacco) complicating pregnancy, unspecified trimester: Secondary | ICD-10-CM | POA: Insufficient documentation

## 2020-07-13 DIAGNOSIS — F17210 Nicotine dependence, cigarettes, uncomplicated: Secondary | ICD-10-CM | POA: Insufficient documentation

## 2020-07-13 DIAGNOSIS — Z3A37 37 weeks gestation of pregnancy: Secondary | ICD-10-CM | POA: Insufficient documentation

## 2020-07-13 DIAGNOSIS — O24415 Gestational diabetes mellitus in pregnancy, controlled by oral hypoglycemic drugs: Secondary | ICD-10-CM | POA: Diagnosis not present

## 2020-07-13 DIAGNOSIS — O09893 Supervision of other high risk pregnancies, third trimester: Secondary | ICD-10-CM

## 2020-07-13 DIAGNOSIS — Z7984 Long term (current) use of oral hypoglycemic drugs: Secondary | ICD-10-CM | POA: Diagnosis not present

## 2020-07-13 DIAGNOSIS — O99333 Smoking (tobacco) complicating pregnancy, third trimester: Secondary | ICD-10-CM | POA: Insufficient documentation

## 2020-07-13 HISTORY — DX: Type 2 diabetes mellitus without complications: E11.9

## 2020-07-13 NOTE — Discharge Summary (Signed)
Carmen Gibbs is a G2P1001 at [redacted]w[redacted]d with an EDD of Estimated Date of Delivery: 07/30/20.  She presents to L&D for antenatal NST for gestational diabetes.  S: Denies contractions, LOF, or vaginal bleeding.  Endorses good fetal movement.   O: BP 124/81   Pulse 86   Temp 98 F (36.7 C) (Oral)   Resp 18   Ht 5\' 11"  (1.803 m)   Wt 101.6 kg   LMP 10/19/2019 (Within Days)   BMI 31.24 kg/m    Patient Active Problem List   Diagnosis Date Noted  . Tobacco use in pregnancy, antepartum 07/13/2020  . Gestational diabetes mellitus (GDM) controlled on oral hypoglycemic drug 07/13/2020  . Back pain affecting pregnancy in third trimester 06/14/2020  . Supervision of other high risk pregnancies, third trimester 01/06/2020  . Varicose veins of left lower extremity with inflammation 09/12/2019  . Chronic gastritis without bleeding 04/20/2019  . S/P cesarean section 05/21/2018  . Bipolar 1 disorder, depressed, severe (HCC) 01/03/2016  . S/P laparoscopic cholecystectomy 12/31/2014  . BMI 28.0-28.9,adult 08/01/2012  . Scoliosis 08/01/2012   Past Medical History:  Diagnosis Date  . Allergy   . Diabetes mellitus without complication (HCC)    gestational  . GERD (gastroesophageal reflux disease)   . History of PCOS   . Mental disorder    bipolar   . Positive serology for Helicobacter pylori 08/08/2012  . Scoliosis    Past Surgical History:  Procedure Laterality Date  . CESAREAN SECTION N/A 05/21/2018   Procedure: CESAREAN SECTION;  Surgeon: 13/02/2018, MD;  Location: [facility] BIRTHING SUITES;  Service: Obstetrics;  Laterality: N/A;  Primary edc 06/01/18 06/03/18, RNFA  . CHOLECYSTECTOMY    . WISDOM TOOTH EXTRACTION     OB History  Gravida Para Term Preterm AB Living  2 1 1  0 0 1  SAB IAB Ectopic Multiple Live Births  0 0 0 0 0    # Outcome Date GA Lbr Len/2nd Weight Sex Delivery Anes PTL Lv  2 Current           1 Term            Social History:  reports that she has been smoking cigarettes.  She has been smoking about 0.50 packs per day. She has never used smokeless tobacco. She reports previous alcohol use of about 4.0 standard drinks of alcohol per week. She reports that she does not use drugs.   Current Outpatient Medications  Medication Instructions  . ARIPiprazole (ABILIFY) 2 mg, Oral, Daily  . CALCIUM PO 1 tablet, Oral, Daily at bedtime  . cyclobenzaprine (FLEXERIL) 5 mg, Oral, 3 times daily PRN  . diazepam (VALIUM) 2 mg, Oral, Every 6 hours PRN  . hydrOXYzine (ATARAX/VISTARIL) 50 mg, Oral, 3 times daily PRN  . ibuprofen (ADVIL) 600 mg, Oral, Every 6 hours  . levocetirizine (XYZAL) 5 mg, Oral, Every evening  . loratadine (CLARITIN) 10 mg, Oral, Daily  . metFORMIN (GLUCOPHAGE) 500 mg, Oral,  Once  . omeprazole (PRILOSEC) 40 mg, Oral, Daily  . Prenatal Vit-Fe Fumarate-FA (PRENATAL MULTIVITAMIN) TABS tablet 1 tablet, Oral, Daily  . sertraline (ZOLOFT) 100 mg, Oral, Daily    NST: Baseline: 145bpm Variability: moderate Accels: >2 15x15 Decels: absent Toco: Occasional mild contractions  Cat: I  A/P 1. NST today reactive 2. Discharge home in stable condition  3. Follow up in office as scheduled   Odelia Gage, CNM Certified Nurse Midwife [facility]

## 2020-07-13 NOTE — Progress Notes (Signed)
AVS paperwork reviewed with pt. No questions/concerns at this time. Pt ambulatory to private vehicle. . VSS. Belongings on person when discharged.

## 2020-07-18 ENCOUNTER — Observation Stay
Admission: EM | Admit: 2020-07-18 | Discharge: 2020-07-18 | Disposition: A | Discharge status: Home / Self Care | Payer: BC Managed Care – PPO | Attending: Obstetrics and Gynecology | Admitting: Obstetrics and Gynecology

## 2020-07-18 ENCOUNTER — Other Ambulatory Visit: Payer: Self-pay

## 2020-07-18 ENCOUNTER — Encounter: Payer: Self-pay | Admitting: Obstetrics and Gynecology

## 2020-07-18 DIAGNOSIS — Z3A38 38 weeks gestation of pregnancy: Secondary | ICD-10-CM | POA: Insufficient documentation

## 2020-07-18 DIAGNOSIS — O4290 Premature rupture of membranes, unspecified as to length of time between rupture and onset of labor, unspecified weeks of gestation: Secondary | ICD-10-CM | POA: Diagnosis present

## 2020-07-18 LAB — WET PREP, GENITAL
Clue Cells Wet Prep HPF POC: NONE SEEN
Sperm: NONE SEEN
Trich, Wet Prep: NONE SEEN
Yeast Wet Prep HPF POC: NONE SEEN

## 2020-07-18 LAB — RUPTURE OF MEMBRANE (ROM)PLUS: Rom Plus: NEGATIVE

## 2020-07-18 MED ORDER — ACETAMINOPHEN 325 MG PO TABS: 650.0000 mg | ORAL_TABLET | ORAL | Status: DC | PRN

## 2020-07-18 NOTE — Discharge Summary (Signed)
Patient ID: Carmen Gibbs MRN: [id number] DOB/AGE: 1985/08/11 35 y.o.  Admit date: 07/18/2020 Discharge date: 08/01/2020  Admission Diagnoses: Uterine contractions  Discharge Diagnoses: No labor  Prenatal Procedures: NST  Consults: None  Significant Diagnostic Studies:  No results found for this or any previous visit (from the past 168 hour(s)).  Treatments: none  Hospital Course:  This is a 35 y.o. G2P1001 with IUP at [redacted]w[redacted]d admitted for uterine contractions, noted to have a cervical exam of 0/50-60/posterior and soft.  No leaking of fluid and no bleeding.  She was observed, fetal heart rate monitoring remained reassuring, and she had no signs/symptoms of progressing labor or other maternal-fetal concerns.  Her cervical exam was unchanged from admission.  She was deemed stable for discharge to home with outpatient follow up.  Discharge Physical Exam:  BP 131/78   Pulse (!) 101   Temp 98 F (36.7 C)   Resp 18   LMP 10/19/2019 (Within Days)   General: NAD CV: RRR Pulm: CTABL, nl effort ABD: s/nd/nt, gravid DVT Evaluation: LE non-ttp, no evidence of DVT on exam.  NST: FHR baseline: 135 bpm Variability: moderate Accelerations: yes Decelerations: none Time: 60+ minutes Category/reactivity: reactive  TOCO: q33min  Dilation: Closed Effacement (%): 50 Cervical Position: Posterior Station: Ballotable Exam by:: Patrick Jupiter, RN   Discharge Condition: Stable  Disposition:  There are no questions and answers to display.         Allergies as of 07/18/2020      Reactions   Ciprofloxacin Hives   Nicotine Other (See Comments)   Erythematic rash caused by Nicotine Patch   Latex Swelling, Rash      Medication List    TAKE these medications   ARIPiprazole 2 MG tablet Commonly known as: ABILIFY Take 2 mg by mouth at bedtime.   hydrOXYzine 50 MG tablet Commonly known as: ATARAX/VISTARIL Take 50 mg by mouth at bedtime.   ibuprofen 600 MG tablet Commonly known  as: ADVIL Take 1 tablet (600 mg total) by mouth every 6 (six) hours.   loratadine 10 MG tablet Commonly known as: CLARITIN Take 10 mg by mouth at bedtime.   omeprazole 40 MG capsule Commonly known as: PriLOSEC Take 1 capsule (40 mg total) by mouth daily.   prenatal multivitamin Tabs tablet Take 1 tablet by mouth at bedtime. DRA 200 mg   sertraline 100 MG tablet Commonly known as: ZOLOFT Take 100 mg by mouth at bedtime.       Follow-up Information    [facility]. Go on 07/23/2020.   Why: go for preadmission testing Friday Contact information: [address] [place] [address]              Signed:  Quillian Quince 08/01/2020 7:59 AM

## 2020-07-18 NOTE — Discharge Instructions (Signed)
Call provider or return to birthplace with: ° °1. Strong regular contractions every 5 minutes. °2. Leaking of fluid from your vagina °3. Vaginal bleeding: Bright red or heavy like a period °4. Decreased Fetal movement ° °

## 2020-07-20 ENCOUNTER — Other Ambulatory Visit: Payer: Self-pay

## 2020-07-20 ENCOUNTER — Other Ambulatory Visit
Admission: RE | Admit: 2020-07-20 | Discharge: 2020-07-20 | Disposition: A | Discharge status: Home / Self Care | Payer: BC Managed Care – PPO | Source: Ambulatory Visit | Attending: Obstetrics and Gynecology | Admitting: Obstetrics and Gynecology

## 2020-07-20 DIAGNOSIS — Z01812 Encounter for preprocedural laboratory examination: Secondary | ICD-10-CM | POA: Insufficient documentation

## 2020-07-20 DIAGNOSIS — U071 COVID-19: Secondary | ICD-10-CM | POA: Insufficient documentation

## 2020-07-20 LAB — SARS CORONAVIRUS 2 (TAT 6-24 HRS): SARS Coronavirus 2: POSITIVE — AB

## 2020-07-22 ENCOUNTER — Encounter: Payer: Self-pay | Admitting: Obstetrics and Gynecology

## 2020-07-22 ENCOUNTER — Telehealth: Payer: Self-pay | Admitting: Obstetrics and Gynecology

## 2020-07-22 NOTE — Telephone Encounter (Signed)
TC to patient to notify her about pre-admission COVID screen positive.   Asymptomatic- reports that 2 days after Christmas had "sinus infection sx" and felt pretty bad but feels fine now.   Pt notified of one visitor policy and that he would need to stay with her and not leave. If he leaves, then would not be able to return.   Pt expressed understanding.   Randa Ngo, CNM 07/22/2020 3:18 PM

## 2020-07-23 ENCOUNTER — Inpatient Hospital Stay: Payer: BC Managed Care – PPO | Admitting: Certified Registered Nurse Anesthetist

## 2020-07-23 ENCOUNTER — Encounter: Payer: Self-pay | Admitting: Obstetrics and Gynecology

## 2020-07-23 ENCOUNTER — Inpatient Hospital Stay
Admission: RE | Admit: 2020-07-23 | Discharge: 2020-07-25 | DRG: 783 | Disposition: A | Discharge status: Home / Self Care | Payer: BC Managed Care – PPO | Attending: Obstetrics and Gynecology | Admitting: Obstetrics and Gynecology

## 2020-07-23 ENCOUNTER — Encounter
Admission: RE | Disposition: A | Discharge status: Home / Self Care | Payer: Self-pay | Source: Home / Self Care | Attending: Obstetrics and Gynecology

## 2020-07-23 ENCOUNTER — Other Ambulatory Visit: Payer: Self-pay | Admitting: Obstetrics and Gynecology

## 2020-07-23 ENCOUNTER — Other Ambulatory Visit: Payer: Self-pay

## 2020-07-23 DIAGNOSIS — O99824 Streptococcus B carrier state complicating childbirth: Secondary | ICD-10-CM | POA: Diagnosis present

## 2020-07-23 DIAGNOSIS — O24415 Gestational diabetes mellitus in pregnancy, controlled by oral hypoglycemic drugs: Secondary | ICD-10-CM

## 2020-07-23 DIAGNOSIS — Z302 Encounter for sterilization: Secondary | ICD-10-CM | POA: Diagnosis not present

## 2020-07-23 DIAGNOSIS — Z6791 Unspecified blood type, Rh negative: Secondary | ICD-10-CM | POA: Diagnosis not present

## 2020-07-23 DIAGNOSIS — U071 COVID-19: Secondary | ICD-10-CM | POA: Diagnosis present

## 2020-07-23 DIAGNOSIS — D62 Acute posthemorrhagic anemia: Secondary | ICD-10-CM | POA: Diagnosis not present

## 2020-07-23 DIAGNOSIS — O0993 Supervision of high risk pregnancy, unspecified, third trimester: Secondary | ICD-10-CM

## 2020-07-23 DIAGNOSIS — Z91040 Latex allergy status: Secondary | ICD-10-CM | POA: Diagnosis not present

## 2020-07-23 DIAGNOSIS — Z3A39 39 weeks gestation of pregnancy: Secondary | ICD-10-CM | POA: Diagnosis not present

## 2020-07-23 DIAGNOSIS — O9852 Other viral diseases complicating childbirth: Secondary | ICD-10-CM | POA: Diagnosis present

## 2020-07-23 DIAGNOSIS — O24425 Gestational diabetes mellitus in childbirth, controlled by oral hypoglycemic drugs: Secondary | ICD-10-CM | POA: Diagnosis present

## 2020-07-23 DIAGNOSIS — O99344 Other mental disorders complicating childbirth: Secondary | ICD-10-CM | POA: Diagnosis present

## 2020-07-23 DIAGNOSIS — O9081 Anemia of the puerperium: Secondary | ICD-10-CM | POA: Diagnosis not present

## 2020-07-23 DIAGNOSIS — F17210 Nicotine dependence, cigarettes, uncomplicated: Secondary | ICD-10-CM | POA: Diagnosis present

## 2020-07-23 DIAGNOSIS — O34211 Maternal care for low transverse scar from previous cesarean delivery: Principal | ICD-10-CM | POA: Diagnosis present

## 2020-07-23 DIAGNOSIS — O99334 Smoking (tobacco) complicating childbirth: Secondary | ICD-10-CM | POA: Diagnosis present

## 2020-07-23 DIAGNOSIS — F314 Bipolar disorder, current episode depressed, severe, without psychotic features: Secondary | ICD-10-CM | POA: Diagnosis present

## 2020-07-23 DIAGNOSIS — M419 Scoliosis, unspecified: Secondary | ICD-10-CM | POA: Diagnosis present

## 2020-07-23 DIAGNOSIS — O34219 Maternal care for unspecified type scar from previous cesarean delivery: Secondary | ICD-10-CM | POA: Diagnosis present

## 2020-07-23 DIAGNOSIS — O26893 Other specified pregnancy related conditions, third trimester: Secondary | ICD-10-CM | POA: Diagnosis present

## 2020-07-23 LAB — TYPE AND SCREEN
ABO/RH(D): O NEG
Antibody Screen: NEGATIVE

## 2020-07-23 LAB — CBC
HCT: 36.8 % (ref 36.0–46.0)
Hemoglobin: 12.7 g/dL (ref 12.0–15.0)
MCH: 30.8 pg (ref 26.0–34.0)
MCHC: 34.5 g/dL (ref 30.0–36.0)
MCV: 89.3 fL (ref 80.0–100.0)
Platelets: 245 10*3/uL (ref 150–400)
RBC: 4.12 MIL/uL (ref 3.87–5.11)
RDW: 14.6 % (ref 11.5–15.5)
WBC: 11 10*3/uL — ABNORMAL HIGH (ref 4.0–10.5)
nRBC: 0 % (ref 0.0–0.2)

## 2020-07-23 LAB — BASIC METABOLIC PANEL
Anion gap: 10 (ref 5–15)
BUN: 9 mg/dL (ref 6–20)
CO2: 20 mmol/L — ABNORMAL LOW (ref 22–32)
Calcium: 9.1 mg/dL (ref 8.9–10.3)
Chloride: 105 mmol/L (ref 98–111)
Creatinine, Ser: 0.37 mg/dL — ABNORMAL LOW (ref 0.44–1.00)
GFR, Estimated: >60 mL/min (ref >60)
Glucose, Bld: 88 mg/dL (ref 70–99)
Potassium: 3.8 mmol/L (ref 3.5–5.1)
Sodium: 135 mmol/L (ref 135–145)

## 2020-07-23 SURGERY — Surgical Case
Anesthesia: Spinal | Laterality: Bilateral

## 2020-07-23 MED ORDER — METHYLENE BLUE 0.5 % INJ SOLN
INTRAVENOUS | Status: AC
  Filled 2020-07-23: qty 10

## 2020-07-23 MED ORDER — OXYTOCIN-SODIUM CHLORIDE 30-0.9 UT/500ML-% IV SOLN
2.5000 [IU]/h | INTRAVENOUS | Status: AC
  Administered 2020-07-23: 2.5 [IU]/h via INTRAVENOUS

## 2020-07-23 MED ORDER — LACTATED RINGERS IV SOLN: INTRAVENOUS | Status: DC

## 2020-07-23 MED ORDER — GABAPENTIN 300 MG PO CAPS
300.0000 mg | ORAL_CAPSULE | Freq: Every day | ORAL | Status: DC
  Administered 2020-07-23 – 2020-07-24 (×2): 300 mg via ORAL
  Filled 2020-07-23 (×2): qty 1

## 2020-07-23 MED ORDER — KETOROLAC TROMETHAMINE 30 MG/ML IJ SOLN: 30.0000 mg | Freq: Four times a day (QID) | INTRAMUSCULAR | Status: DC

## 2020-07-23 MED ORDER — NALBUPHINE HCL 10 MG/ML IJ SOLN: 5.0000 mg | INTRAMUSCULAR | Status: DC | PRN

## 2020-07-23 MED ORDER — OXYTOCIN-SODIUM CHLORIDE 30-0.9 UT/500ML-% IV SOLN
INTRAVENOUS | Status: AC
  Filled 2020-07-23: qty 500

## 2020-07-23 MED ORDER — PHENYLEPHRINE HCL (PRESSORS) 10 MG/ML IV SOLN
INTRAVENOUS | Status: AC
  Filled 2020-07-23: qty 1

## 2020-07-23 MED ORDER — POVIDONE-IODINE 10 % EX SWAB: 2.0000 "application " | Freq: Once | CUTANEOUS | Status: DC

## 2020-07-23 MED ORDER — DIPHENHYDRAMINE HCL 25 MG PO CAPS
25.0000 mg | ORAL_CAPSULE | Freq: Four times a day (QID) | ORAL | Status: DC | PRN
  Administered 2020-07-24 (×2): 25 mg via ORAL
  Filled 2020-07-23 (×2): qty 1

## 2020-07-23 MED ORDER — PRENATAL MULTIVITAMIN CH
1.0000 | ORAL_TABLET | Freq: Every day | ORAL | Status: DC
  Administered 2020-07-24: 1 via ORAL
  Filled 2020-07-23: qty 1

## 2020-07-23 MED ORDER — LIDOCAINE HCL (PF) 1 % IJ SOLN
INTRAMUSCULAR | Status: DC | PRN
  Administered 2020-07-23: 3 mL

## 2020-07-23 MED ORDER — MEASLES, MUMPS & RUBELLA VAC IJ SOLR
0.5000 mL | INTRAMUSCULAR | Status: DC | PRN
  Filled 2020-07-23: qty 0.5

## 2020-07-23 MED ORDER — MAGNESIUM OXIDE 400 (241.3 MG) MG PO TABS
200.0000 mg | ORAL_TABLET | Freq: Every day | ORAL | Status: DC
  Administered 2020-07-23 – 2020-07-24 (×2): 200 mg via ORAL
  Filled 2020-07-23 (×3): qty 0.5

## 2020-07-23 MED ORDER — ACETAMINOPHEN 325 MG PO TABS
650.0000 mg | ORAL_TABLET | Freq: Four times a day (QID) | ORAL | Status: DC
  Administered 2020-07-23: 650 mg via ORAL
  Filled 2020-07-23: qty 2

## 2020-07-23 MED ORDER — FENTANYL CITRATE (PF) 100 MCG/2ML IJ SOLN
INTRAMUSCULAR | Status: DC | PRN
  Administered 2020-07-23: 15 ug via INTRATHECAL

## 2020-07-23 MED ORDER — SODIUM CHLORIDE (PF) 0.9 % IJ SOLN
INTRAMUSCULAR | Status: AC
  Filled 2020-07-23: qty 50

## 2020-07-23 MED ORDER — OXYCODONE HCL 5 MG PO TABS: 5.0000 mg | ORAL_TABLET | ORAL | Status: AC | PRN

## 2020-07-23 MED ORDER — KETOROLAC TROMETHAMINE 30 MG/ML IJ SOLN
30.0000 mg | Freq: Four times a day (QID) | INTRAMUSCULAR | Status: DC
  Administered 2020-07-24: 30 mg via INTRAVENOUS
  Filled 2020-07-23: qty 1

## 2020-07-23 MED ORDER — ARIPIPRAZOLE 2 MG PO TABS
2.0000 mg | ORAL_TABLET | Freq: Every day | ORAL | Status: DC
  Administered 2020-07-23 – 2020-07-24 (×2): 2 mg via ORAL
  Filled 2020-07-23 (×3): qty 1

## 2020-07-23 MED ORDER — OXYCODONE HCL 5 MG PO TABS
5.0000 mg | ORAL_TABLET | ORAL | Status: DC | PRN
  Administered 2020-07-24 (×3): 10 mg via ORAL
  Administered 2020-07-25: 5 mg via ORAL
  Filled 2020-07-23 (×2): qty 2
  Filled 2020-07-23: qty 1
  Filled 2020-07-23: qty 2

## 2020-07-23 MED ORDER — IBUPROFEN 800 MG PO TABS: 800.0000 mg | ORAL_TABLET | Freq: Four times a day (QID) | ORAL | Status: DC

## 2020-07-23 MED ORDER — NALBUPHINE HCL 10 MG/ML IJ SOLN
INTRAMUSCULAR | Status: AC
  Filled 2020-07-23: qty 1

## 2020-07-23 MED ORDER — MENTHOL 3 MG MT LOZG
1.0000 | LOZENGE | OROMUCOSAL | Status: DC | PRN
Start: 2020-07-23 — End: 2020-07-25
  Filled 2020-07-23: qty 9

## 2020-07-23 MED ORDER — MORPHINE SULFATE (PF) 0.5 MG/ML IJ SOLN
INTRAMUSCULAR | Status: DC | PRN
  Administered 2020-07-23: 100 ug via INTRATHECAL

## 2020-07-23 MED ORDER — MORPHINE SULFATE (PF) 0.5 MG/ML IJ SOLN
INTRAMUSCULAR | Status: AC
  Filled 2020-07-23: qty 10

## 2020-07-23 MED ORDER — SODIUM CHLORIDE 0.9% FLUSH
50.0000 mL | Freq: Once | INTRAVENOUS | Status: DC
  Filled 2020-07-23: qty 51

## 2020-07-23 MED ORDER — CEFAZOLIN SODIUM-DEXTROSE 2-4 GM/100ML-% IV SOLN
2.0000 g | INTRAVENOUS | Status: AC
Start: 2020-07-23 — End: 2020-07-23
  Administered 2020-07-23: 2 g via INTRAVENOUS
  Filled 2020-07-23: qty 100

## 2020-07-23 MED ORDER — STERILE WATER FOR IRRIGATION IR SOLN
Status: DC | PRN
  Administered 2020-07-23: 70 mL via INTRAVESICAL

## 2020-07-23 MED ORDER — OXYTOCIN-SODIUM CHLORIDE 30-0.9 UT/500ML-% IV SOLN
INTRAVENOUS | Status: DC | PRN
  Administered 2020-07-23 (×2): 500 mL via INTRAVENOUS

## 2020-07-23 MED ORDER — FLEET ENEMA 7-19 GM/118ML RE ENEM: 1.0000 | ENEMA | Freq: Every day | RECTAL | Status: DC | PRN

## 2020-07-23 MED ORDER — BUPIVACAINE IN DEXTROSE 0.75-8.25 % IT SOLN
INTRATHECAL | Status: DC | PRN
  Administered 2020-07-23: 1.7 mL via INTRATHECAL

## 2020-07-23 MED ORDER — BISACODYL 10 MG RE SUPP: 10.0000 mg | Freq: Every day | RECTAL | Status: DC | PRN

## 2020-07-23 MED ORDER — HYDROXYZINE HCL 25 MG PO TABS
50.0000 mg | ORAL_TABLET | Freq: Every day | ORAL | Status: DC
  Administered 2020-07-23 – 2020-07-24 (×2): 50 mg via ORAL
  Filled 2020-07-23 (×2): qty 2

## 2020-07-23 MED ORDER — SODIUM CHLORIDE FLUSH 0.9 % IV SOLN
INTRAVENOUS | Status: DC | PRN
  Administered 2020-07-23: 100 mL

## 2020-07-23 MED ORDER — TETANUS-DIPHTH-ACELL PERTUSSIS 5-2.5-18.5 LF-MCG/0.5 IM SUSY: 0.5000 mL | PREFILLED_SYRINGE | Freq: Once | INTRAMUSCULAR | Status: DC

## 2020-07-23 MED ORDER — DIBUCAINE (PERIANAL) 1 % EX OINT
1.0000 | TOPICAL_OINTMENT | CUTANEOUS | Status: DC | PRN
Start: 2020-07-23 — End: 2020-07-25

## 2020-07-23 MED ORDER — NALBUPHINE HCL 10 MG/ML IJ SOLN
5.0000 mg | INTRAMUSCULAR | Status: DC | PRN
  Administered 2020-07-23 – 2020-07-24 (×3): 5 mg via INTRAVENOUS
  Filled 2020-07-23 (×2): qty 1

## 2020-07-23 MED ORDER — BUPIVACAINE HCL (PF) 0.5 % IJ SOLN
INTRAMUSCULAR | Status: AC
  Filled 2020-07-23: qty 30

## 2020-07-23 MED ORDER — COCONUT OIL OIL: 1.0000 "application " | TOPICAL_OIL | Status: DC | PRN

## 2020-07-23 MED ORDER — SOD CITRATE-CITRIC ACID 500-334 MG/5ML PO SOLN
30.0000 mL | ORAL | Status: AC
  Administered 2020-07-23: 30 mL via ORAL
  Filled 2020-07-23: qty 15

## 2020-07-23 MED ORDER — ONDANSETRON HCL 4 MG/2ML IJ SOLN
INTRAMUSCULAR | Status: AC
  Filled 2020-07-23: qty 2

## 2020-07-23 MED ORDER — HEMOSTATIC AGENTS (NO CHARGE) OPTIME: TOPICAL | Status: DC | PRN

## 2020-07-23 MED ORDER — SODIUM CHLORIDE 0.9 % IV SOLN
INTRAVENOUS | Status: DC | PRN
  Administered 2020-07-23: 40 ug/min via INTRAVENOUS

## 2020-07-23 MED ORDER — LORATADINE 10 MG PO TABS
10.0000 mg | ORAL_TABLET | Freq: Every day | ORAL | Status: DC
  Administered 2020-07-23 – 2020-07-24 (×2): 10 mg via ORAL
  Filled 2020-07-23 (×3): qty 1

## 2020-07-23 MED ORDER — ACETAMINOPHEN 500 MG PO TABS
1000.0000 mg | ORAL_TABLET | Freq: Four times a day (QID) | ORAL | Status: DC
  Administered 2020-07-24: 1000 mg via ORAL
  Filled 2020-07-23: qty 2

## 2020-07-23 MED ORDER — WITCH HAZEL-GLYCERIN EX PADS: 1.0000 "application " | MEDICATED_PAD | CUTANEOUS | Status: DC | PRN

## 2020-07-23 MED ORDER — FERROUS SULFATE 325 (65 FE) MG PO TABS
325.0000 mg | ORAL_TABLET | Freq: Two times a day (BID) | ORAL | Status: DC
  Administered 2020-07-24 – 2020-07-25 (×3): 325 mg via ORAL
  Filled 2020-07-23 (×3): qty 1

## 2020-07-23 MED ORDER — BUPIVACAINE LIPOSOME 1.3 % IJ SUSP
20.0000 mL | Freq: Once | INTRAMUSCULAR | Status: DC
  Filled 2020-07-23: qty 20

## 2020-07-23 MED ORDER — PHENYLEPHRINE HCL (PRESSORS) 10 MG/ML IV SOLN
INTRAVENOUS | Status: DC | PRN
  Administered 2020-07-23: 50 ug via INTRAVENOUS

## 2020-07-23 MED ORDER — SENNOSIDES-DOCUSATE SODIUM 8.6-50 MG PO TABS
2.0000 | ORAL_TABLET | ORAL | Status: DC
  Administered 2020-07-23 – 2020-07-24 (×2): 2 via ORAL
  Filled 2020-07-23 (×2): qty 2

## 2020-07-23 MED ORDER — SIMETHICONE 80 MG PO CHEW
80.0000 mg | CHEWABLE_TABLET | ORAL | Status: DC | PRN
  Administered 2020-07-24: 80 mg via ORAL
  Filled 2020-07-23: qty 1

## 2020-07-23 MED ORDER — SIMETHICONE 80 MG PO CHEW
80.0000 mg | CHEWABLE_TABLET | Freq: Three times a day (TID) | ORAL | Status: DC
  Administered 2020-07-24 – 2020-07-25 (×4): 80 mg via ORAL
  Filled 2020-07-23 (×4): qty 1

## 2020-07-23 MED ORDER — ONDANSETRON HCL 4 MG/2ML IJ SOLN
INTRAMUSCULAR | Status: DC | PRN
  Administered 2020-07-23: 4 mg via INTRAVENOUS

## 2020-07-23 MED ORDER — KETOROLAC TROMETHAMINE 30 MG/ML IJ SOLN
30.0000 mg | Freq: Four times a day (QID) | INTRAMUSCULAR | Status: DC
  Administered 2020-07-23 (×2): 30 mg via INTRAVENOUS
  Filled 2020-07-23 (×2): qty 1

## 2020-07-23 MED ORDER — FENTANYL CITRATE (PF) 100 MCG/2ML IJ SOLN
INTRAMUSCULAR | Status: AC
  Filled 2020-07-23: qty 2

## 2020-07-23 MED ORDER — SERTRALINE HCL 100 MG PO TABS
100.0000 mg | ORAL_TABLET | Freq: Every day | ORAL | Status: DC
  Administered 2020-07-23 – 2020-07-24 (×2): 100 mg via ORAL
  Filled 2020-07-23 (×2): qty 1

## 2020-07-23 MED ORDER — COCONUT OIL OIL
TOPICAL_OIL | Status: AC
  Filled 2020-07-23: qty 120

## 2020-07-23 SURGICAL SUPPLY — 29 items
CANISTER SUCT 3000ML PPV (MISCELLANEOUS) ×2 IMPLANT
CHLORAPREP W/TINT 26 (MISCELLANEOUS) ×2 IMPLANT
COVER WAND RF STERILE (DRAPES) ×2 IMPLANT
DRSG TELFA 3X8 NADH (GAUZE/BANDAGES/DRESSINGS) ×2 IMPLANT
ELECT REM PT RETURN 9FT ADLT (ELECTROSURGICAL) ×2
ELECTRODE REM PT RTRN 9FT ADLT (ELECTROSURGICAL) ×1 IMPLANT
EXTRACTOR VACUUM KIWI (MISCELLANEOUS) ×2 IMPLANT
GAUZE CURAFIL 4X4 (GAUZE/BANDAGES/DRESSINGS) IMPLANT
GAUZE SPONGE 4X4 12PLY STRL (GAUZE/BANDAGES/DRESSINGS) ×2 IMPLANT
GOWN STRL REUS W/ TWL LRG LVL3 (GOWN DISPOSABLE) ×3 IMPLANT
GOWN STRL REUS W/TWL LRG LVL3 (GOWN DISPOSABLE) ×3
MANIFOLD NEPTUNE II (INSTRUMENTS) ×2 IMPLANT
NEEDLE HYPO 25GX1X1/2 BEV (NEEDLE) ×2 IMPLANT
NS IRRIG 1000ML POUR BTL (IV SOLUTION) ×2 IMPLANT
PACK C SECTION AR (MISCELLANEOUS) ×2 IMPLANT
PAD OB MATERNITY 4.3X12.25 (PERSONAL CARE ITEMS) ×2 IMPLANT
PAD PREP 24X41 OB/GYN DISP (PERSONAL CARE ITEMS) ×2 IMPLANT
PENCIL SMOKE ULTRAEVAC 22 CON (MISCELLANEOUS) ×2 IMPLANT
STAPLER INSORB 30 2030 C-SECTI (MISCELLANEOUS) ×2 IMPLANT
SUT MNCRL 4-0 (SUTURE) ×1
SUT MNCRL 4-0 27XMFL (SUTURE) ×1
SUT VIC AB 0 CT1 36 (SUTURE) ×4 IMPLANT
SUT VIC AB 0 CTX 36 (SUTURE) ×2
SUT VIC AB 0 CTX36XBRD ANBCTRL (SUTURE) ×2 IMPLANT
SUT VIC AB 2-0 SH 27 (SUTURE) ×2
SUT VIC AB 2-0 SH 27XBRD (SUTURE) ×2 IMPLANT
SUTURE MNCRL 4-0 27XMF (SUTURE) ×1 IMPLANT
SYR 30ML LL (SYRINGE) ×4 IMPLANT
TAPE PAPER 3X10 WHT MICROPORE (GAUZE/BANDAGES/DRESSINGS) ×2 IMPLANT

## 2020-07-23 NOTE — Anesthesia Procedure Notes (Signed)
Spinal  Patient location during procedure: OR Start time: 07/23/2020 12:32 PM End time: 07/23/2020 12:34 PM Staffing Performed: resident/CRNA  Anesthesiologist: [name], DO Resident/CRNA: Jeanine Luz, CRNA Preanesthetic Checklist Completed: patient identified, IV checked, site marked, risks and benefits discussed, surgical consent, monitors and equipment checked and pre-op evaluation Spinal Block Patient position: sitting Prep: ChloraPrep Patient monitoring: heart rate, continuous pulse ox and blood pressure Approach: midline Location: L3-4 Injection technique: single-shot Needle Needle type: Pencan  Needle gauge: 24 G Needle length: 10 cm

## 2020-07-23 NOTE — Anesthesia Preprocedure Evaluation (Signed)
Anesthesia Evaluation  Patient identified by MRN, date of birth, ID band Patient awake    Reviewed: Allergy & Precautions, H&P , NPO status , Patient's Chart, lab work & pertinent test results  Airway Mallampati: II       Dental no notable dental hx.    Pulmonary neg pulmonary ROS, Current Smoker and Patient abstained from smoking.,    Pulmonary exam normal breath sounds clear to auscultation       Cardiovascular negative cardio ROS Normal cardiovascular exam Rhythm:Regular Rate:Normal     Neuro/Psych PSYCHIATRIC DISORDERS Bipolar Disorder negative neurological ROS     GI/Hepatic Neg liver ROS, GERD  ,  Endo/Other  negative endocrine ROSdiabetes  Renal/GU negative Renal ROS  negative genitourinary   Musculoskeletal negative musculoskeletal ROS (+)   Abdominal   Peds negative pediatric ROS (+)  Hematology negative hematology ROS (+)   Anesthesia Other Findings Past Medical History: No date: Allergy No date: Diabetes mellitus without complication (HCC)     Comment:  gestational No date: GERD (gastroesophageal reflux disease) No date: History of PCOS No date: Mental disorder     Comment:  bipolar  08/08/2012: Positive serology for Helicobacter pylori No date: Scoliosis   Reproductive/Obstetrics negative OB ROS                             Anesthesia Physical Anesthesia Plan  ASA: II  Anesthesia Plan: Spinal   Post-op Pain Management:    Induction:   PONV Risk Score and Plan:   Airway Management Planned: Nasal Cannula  Additional Equipment:   Intra-op Plan:   Post-operative Plan:   Informed Consent: I have reviewed the patients History and Physical, chart, labs and discussed the procedure including the risks, benefits and alternatives for the proposed anesthesia with the patient or authorized representative who has indicated his/her understanding and acceptance.        Plan Discussed with: CRNA, Anesthesiologist and Surgeon  Anesthesia Plan Comments:         Anesthesia Quick Evaluation

## 2020-07-23 NOTE — H&P (Signed)
Obstetric Preoperative History and Physical  Cotina Freedman is a 35 y.o. G2P1001 with IUP at [redacted]w[redacted]d presenting for elective repeat scheduled cesarean section.  No acute concerns.   Prenatal Course  Factors complicating this pregnancy: -PTL; seen in triage 12/1 -->BMX q24hrs given 12/1 & 12/1, understands precautions -gDMA2; 1hr GTT: 213             -Lifestyles referral placed, started BS logs 06/02/20             -[place] for growth/AFI at 36wks: 16.51cm @ 61%             -EFW done 07/03/20: (129g) = 64%             -Started 10u NPH nightly 07/03/20, c/w BEB              -Insurance cost is too high for insulin, she declined to start --> started on Metformin 500mg  nightly              -Twice weekly NST w/ weekly AFI ; IOL at 39-40 wks -Smoker; down to 1/2 PPD per 11/8/221, encouraged to cut down gradually              -If continues: EFW & AFI q4wks between 28-32w & weekly NST w/ AFI starting at 36wks -Hx of Depression & Bipolar disorder; asked to be started on meds, no meds prior to pregnancy             -On Zoloft 100mg  nightly, started Abilify 2mg  as well             -EPDS: 9/28: 17, 10/25: 10             -Working with [facility] perinatal psych for referrals              -Pt declines counseling, consult placed to [facility]  -Rh neg; did not get rhogam last pregnancy, FOB has record that he is AB neg             -Rhogam at 28wks declined -Hx of prior C/S x1; pt w/ scoliosis, baby malpositioned, record request sent              -Considering TOLAC, TOLAC counseling ?  -Traumatic birth experience with last delivery  -Latex allergy  -Scoliosis; needs anesthesia consult              -Pt reports difficulty w/ numbing meds "takes more to numb than usual" -Fetal pelviectasis; 05/07/20: R renal pelvis 0.61cm, 11/22: R renal pelvis 0.63cm  NST Today: Baseline: 145 Variability: Moderate Accelerations: 2 15x15 bpm Decelerations: None  S: No CTX, LOF, VB, DF. Good fetal movement. -Pt feels so  much pressure and pain at her vagina. She is hoping she is dilated. O: BP 125/80   Pulse 86   Ht 180.3 cm (5' 10.98")   Wt (!) 103.9 kg (229 lb)   LMP 10/19/2019 (Approximate)   BMI 31.95 kg/m   Abdomen: Gravid, nontender; Ext : no edema, no rashes SVE closed/soft/-2  Fetal heart tones: 145 distinguished from mom   Patient Active Problem List   Diagnosis Date Noted  . Previous cesarean delivery affecting pregnancy 07/23/2020  . Amniotic fluid leaking 07/18/2020  . Tobacco use in pregnancy, antepartum 07/13/2020  . Gestational diabetes mellitus (GDM) controlled on oral hypoglycemic drug 07/13/2020  . Back pain affecting pregnancy in third trimester 06/14/2020  . Supervision of other high risk pregnancies, third trimester 01/06/2020  . Varicose veins  of left lower extremity with inflammation 09/12/2019  . Chronic gastritis without bleeding 04/20/2019  . S/P cesarean section 05/21/2018  . Bipolar 1 disorder, depressed, severe (HCC) 01/03/2016  . S/P laparoscopic cholecystectomy 12/31/2014  . BMI 28.0-28.9,adult 08/01/2012  . Scoliosis 08/01/2012   She desires bilateral tubal ligation for postpartum contraception.   Prenatal labs and studies: ABO, Rh: --/--/O NEG (12/01 1909) Antibody: NEG (12/01 1909) Rubella: Immune (06/28 0000) RPR: Nonreactive (12/21 0000)  HBsAg: Negative (06/28 0000)  HIV: Non-reactive (12/21 0000)  YNW:GNFAOZHY/GBS:Positive/-- (12/21 0000) 1 hr Glucola  213  Genetic screening normal Anatomy US normal  Prenatal Transfer Tool  Maternal Diabetes: Yes:  Diabetes Type:  Insulin/Medication controlled Genetic Screening: Normal Maternal Ultrasounds/Referrals: Normal  Past Medical History:  Diagnosis Date  . Allergy   . Diabetes mellitus without complication (HCC)    gestational  . GERD (gastroesophageal reflux disease)   . History of PCOS   . Mental disorder    bipolar   . Positive serology for Helicobacter pylori 08/08/2012  . Scoliosis     Past  Surgical History:  Procedure Laterality Date  . CESAREAN SECTION N/A 05/21/2018   Procedure: CESAREAN SECTION;  Surgeon: Marcelle OverlieGrewal, Michelle, MD;  [facility];  Service: Obstetrics;  Laterality: N/A;  Primary edc 06/01/18 Odelia GageHeather K, RNFA  . CHOLECYSTECTOMY    . WISDOM TOOTH EXTRACTION      OB History  Gravida Para Term Preterm AB Living  2 1 1     1   SAB IAB Ectopic Multiple Live Births               # Outcome Date GA Lbr Len/2nd Weight Sex Delivery Anes PTL Lv  2 Current           1 Term             Social History   Socioeconomic History  . Marital status: Married    Spouse name: Luisa Hartatrick  . Number of children: Not on file  . Years of education: Not on file  . Highest education level: Not on file  Occupational History  . Not on file  Tobacco Use  . Smoking status: Current Every Day Smoker    Packs/day: 0.50    Types: Cigarettes  . Smokeless tobacco: Never Used  Vaping Use  . Vaping Use: Never used  Substance and Sexual Activity  . Alcohol use: Not Currently    Alcohol/week: 4.0 standard drinks    Types: 2 Glasses of wine, 2 Cans of beer per week  . Drug use: No  . Sexual activity: Not Currently    Birth control/protection: Other-see comments, Surgical    Comment: tubal  Other Topics Concern  . Not on file  Social History Narrative  . Not on file   Social Determinants of Health   Financial Resource Strain: Not on file  Food Insecurity: Not on file  Transportation Needs: Not on file  Physical Activity: Not on file  Stress: Not on file  Social Connections: Not on file    Family History  Problem Relation Age of Onset  . Diabetes Mother   . Basal cell carcinoma Mother   . Hypertension Father   . Bladder Cancer Father   . Breast cancer Paternal Grandmother     Medications Prior to Admission  Medication Sig Dispense Refill Last Dose  . ARIPiprazole (ABILIFY) 2 MG tablet Take 2 mg by mouth at bedtime.   07/22/2020 at Unknown time  .  loratadine (CLARITIN)  10 MG tablet Take 10 mg by mouth at bedtime.   07/22/2020 at Unknown time  . Magnesium 200 MG TABS Take 200 mg by mouth at bedtime.   07/22/2020 at Unknown time  . metFORMIN (GLUCOPHAGE) 500 MG tablet Take 500 mg by mouth at bedtime.   07/22/2020 at Unknown time  . Prenatal Vit-Fe Fumarate-FA (PRENATAL MULTIVITAMIN) TABS tablet Take 1 tablet by mouth at bedtime. DRA 200 mg   07/22/2020 at Unknown time  . sertraline (ZOLOFT) 100 MG tablet Take 100 mg by mouth at bedtime.   07/22/2020 at Unknown time  . hydrOXYzine (ATARAX/VISTARIL) 50 MG tablet Take 50 mg by mouth at bedtime. (Patient not taking: Reported on 07/23/2020)   Not Taking at Unknown time  . ibuprofen (ADVIL,MOTRIN) 600 MG tablet Take 1 tablet (600 mg total) by mouth every 6 (six) hours. 30 tablet 0   . omeprazole (PRILOSEC) 40 MG capsule Take 1 capsule (40 mg total) by mouth daily. 30 capsule 0     Allergies  Allergen Reactions  . Ciprofloxacin Hives    IV Cipro gives her blisters  . Nicotine Other (See Comments)    Erythematic rash caused by Nicotine Patch  . Latex Swelling and Rash    Review of Systems: Negative except for what is mentioned in HPI.  Physical Exam: BP 117/83 (BP Location: Left Arm)   Pulse (!) 104   Temp 98.4 F (36.9 C) (Oral)   Resp 18   Ht 5\' 11"  (1.803 m)   Wt 102 kg   LMP 10/19/2019 (Within Days)   BMI 31.36 kg/m  FHR by Doppler: 145 bpm CONSTITUTIONAL: Well-developed, well-nourished female in no acute distress.  NECK: Normal range of motion, supple, no masses SKIN: Skin is warm and dry. No rash noted. Not diaphoretic. No erythema. No pallor.  NEUROLGIC: Alert and oriented to person, place, and time. Normal reflexes, muscle tone coordination.  PSYCHIATRIC: Normal mood and affect. Normal behavior. Normal judgment and thought content. CARDIOVASCULAR: Normal heart rate noted, regular rhythm RESPIRATORY: Effort and breath sounds normal, no problems with respiration noted ABDOMEN: Soft,  nontender, nondistended, gravid. Well-healed Pfannenstiel incision. PELVIC: Deferred MUSCULOSKELETAL: Normal range of motion. No edema and no tenderness.    Pertinent Labs/Studies:   Results for orders placed or performed during the hospital encounter of 07/23/20 (from the past 72 hour(s))  CBC     Status: Abnormal   Collection Time: 07/23/20 10:56 AM  Result Value Ref Range   WBC 11.0 (H) 4.0 - 10.5 K/uL   RBC 4.12 3.87 - 5.11 MIL/uL   Hemoglobin 12.7 12.0 - 15.0 g/dL   HCT 09/20/20 17.7 - 93.9 %   MCV 89.3 80.0 - 100.0 fL   MCH 30.8 26.0 - 34.0 pg   MCHC 34.5 30.0 - 36.0 g/dL   RDW 03.0 09.2 - 33.0 %   Platelets 245 150 - 400 K/uL   nRBC 0.0 0.0 - 0.2 %    Comment: Performed at [facility], [address], [place], [place]  Basic metabolic panel     Status: Abnormal   Collection Time: 07/23/20 10:56 AM  Result Value Ref Range   Sodium 135 135 - 145 mmol/L   Potassium 3.8 3.5 - 5.1 mmol/L   Chloride 105 98 - 111 mmol/L   CO2 20 (L) 22 - 32 mmol/L   Glucose, Bld 88 70 - 99 mg/dL    Comment: Glucose reference range applies only to samples taken after fasting for at least  8 hours.   BUN 9 6 - 20 mg/dL   Creatinine, Ser 8.14 (L) 0.44 - 1.00 mg/dL   Calcium 9.1 8.9 - 48.1 mg/dL   GFR, Estimated >85 >63 mL/min    Comment: (NOTE) Calculated using the CKD-EPI Creatinine Equation (2021)    Anion gap 10 5 - 15    Comment: Performed at [facility], [address], [place], [place] [address]    Assessment and Plan :Zyana Amaro is a 35 y.o. G2P1001 at [redacted]w[redacted]d being admitted for scheduled cesarean section. The risks of cesarean section discussed with the patient included but were not limited to: bleeding which may require transfusion or reoperation; infection which may require antibiotics; injury to bowel, bladder, ureters or other surrounding organs; injury to the fetus; need for additional procedures including hysterectomy in the event of a  life-threatening hemorrhage; placental abnormalities wth subsequent pregnancies, incisional problems, thromboembolic phenomenon and other postoperative/anesthesia complications. The patient concurred with the proposed plan, giving informed written consent for the procedure. Patient has been NPO since last night she will remain NPO for procedure. Anesthesia and OR aware. Preoperative prophylactic antibiotics and SCDs ordered on call to the OR. To OR when ready.   Patient desires permanent sterilization.  Other reversible forms of contraception were discussed with patient; she declines all other modalities. Risks of procedure discussed with patient including but not limited to: risk of regret, permanence of method, bleeding, infection, injury to surrounding organs and need for additional procedures.  Failure risk of 1-2 % with increased risk of ectopic gestation if pregnancy occurs was also discussed with patient.  Patient verbalized understanding of these risks and wants to proceed with sterilization.  Written informed consent obtained.  To OR when ready.  Tubal ligation   Christeen Douglas, MD, MPH, Evern Core

## 2020-07-23 NOTE — Op Note (Addendum)
Cesarean Section Procedure Note  Indications: prior c-section  Pre-operative Diagnosis:  1. Intrauterine pregnancy at [redacted]w[redacted]d;  2. Desires permanent sterilization;  3. Currently covid positive, asx  Post-operative Diagnosis: same, delivered.  Procedure: 1. Repeat Low Transverse Cesarean Section through Pfannenstiel incision, vacuum-assisted  2. Bilateral tubal sterilization using modified Parkland method  Surgeon: Christeen Douglas, MD  Assistant(s):  am  Anesthesia: Spinal anesthesia  Estimated Blood Loss:  200 mL         Drains: foley         Total IV Fluids: see anesthesia   Urine Output: see anesthesia report          Specimens: Portion of right and left tubes; cord blood for maternal O neg         Complications:  None; patient tolerated the procedure well.         Disposition: PACU - hemodynamically stable.         Condition: stable  Findings:  A female infant "[name]" in cephalic presentation. Amniotic fluid - Clear  Birth weight 7#14oz.  Apgars of 9 and 9.   Intact placenta with a three-vessel cord.  Grossly normal uterus, tubes and ovaries bilaterally. Mild intraabdominal adhesions were noted.  Procedure Details  The patient was taken to Operating Room, identified as the correct patient and the procedure verified as C-Section Delivery. A Time Out was held and the above information confirmed.  After induction of anesthesia, the patient was draped and prepped in the usual sterile manner. A Pfannenstiel incision was made and carried down through the subcutaneous tissue to the fascia. Fascial incision was made and extended transversely with the Mayo scissors. The fascia was separated from the underlying rectus tissue superiorly and inferiorly. The peritoneum was identified and entered bluntly. Peritoneal incision was extended longitudinally. The utero-vesical peritoneal reflection was incised transversely and a bladder flap was created digitally.   A low transverse  hysterotomy was made. The fetus was delivered atraumatically, using a vacuum to assist in holding the floating head steady and bringing it out the hysterotomy. The umbilical cord was clamped x2 and cut and the infant was handed to the awaiting pediatricians. The placenta was removed intact and appeared normal with a 3-vessel cord.   The uterus was exteriorized and cleared of all clot and debris. The hysterotomy was closed with running sutures of  0-Vicryl. Excellent hemostasis was observed.   Attention was then turned to the tubal ligation. The left fallopian tube distinguished from the round ligament by identifying the fimbria and was grasped with a Babcock clamp in the midisthmic portion approximately 3 cm from the cornual region. It was then doubly ligated with 0-plain gut suture in a Parkland fashion. The tubal segment was excised with Metzenbaum scissors. Tubal ostea noted. The procedure was repeated on the right side. *Care was noted to examine both tubal sites in situ to ensure the sutures were intact and no bleeding was noted. The uterus was returned to the abdomen.  Because of amber urine but not because we were worried with the surgical technique, the bladder was backfilled with methylene blue to confirm bladder integrity.  We confirmed no bleeding at each step on the way up to the fascia.  The fascia was then reapproximated with running sutures of 0 Vicryl.  The subcutaneous tissue was reapproximated with running sutures. The skin was reapproximated with Insorb absorbable staples.  Instrument, sponge, and needle counts were correct prior to the abdominal closure and at the conclusion of the  case.   The patient tolerated the procedure well and was transferred to the recovery room in stable condition.   Christeen Douglas, MD1/04/2021

## 2020-07-23 NOTE — Anesthesia Postprocedure Evaluation (Signed)
Anesthesia Post Note  Patient: Carmen Gibbs  Procedure(s) Performed: REPEAT CESAREAN SECTION WITH BILATERAL TUBAL LIGATION (Bilateral )  Patient location during evaluation: L&D Anesthesia Type: Spinal Level of consciousness: awake and alert Pain management: pain level controlled Vital Signs Assessment: post-procedure vital signs reviewed and stable Respiratory status: spontaneous breathing Cardiovascular status: blood pressure returned to baseline and stable Postop Assessment: no headache, spinal receding and no apparent nausea or vomiting Anesthetic complications: no   No complications documented.   Last Vitals:  Vitals:   07/23/20 1017 07/23/20 1415  BP: 117/83 106/76  Pulse: (!) 104   Resp: 18 16  Temp: 36.9 C 36.4 C    Last Pain:  Vitals:   07/23/20 1415  TempSrc: Oral  PainSc:                  [name]

## 2020-07-23 NOTE — Lactation Note (Signed)
This note was copied from a baby's chart. Lactation Consultation Note  Patient Name: Carmen Gibbs [id number] Date: 07/23/2020 Reason for consult: Initial assessment;Mother's request;Term;Other (Comment) (Mom was gestational diabetic, but 1st 2 glucoses were WNL) Age:33 hours  Mom is Covid (+).  She was a gestational diabetic, but first 2 blood glucoses on [name] were WNL.  Mom seems a little anxious reporting [name] seems to want to stay on the breast.  She says when she puts him back down in the crib, he starts rooting again.  Demonstrated hand expression of colostrum.  Encouraged mom to put him to the breast whenever he demonstrated feeding cues.  Assisted mom in comfortable position with pillow support in football hold on left breast and cradle on the right breast.  He has a strong, rhythmic suck with occasional audible swallows.  Mom has a tendency pull back on the breast to allow breathing room and to not keep him close letting him slip to the tip of the nipple.  Mom has small nipples.  Encouraged mom to keep him close with nose and chin touching the breast to sustain a deep latch.  Mom denies any breast or nipple pain, but is asking for nipple cream in case she starts to get sore.  Encouraged mom to hand express some colostrum after breast feeding and rub on nipples to prevent bacteria, lubricate and for comfort.  Coconut oil given with instructions in use which mom said felt really good.  Mom reports trying to breast feed her first, but never had much success.  She said she pumped for a couple of weeks but did not seem to have enough milk so just gave up, but wants to breast feed [name].  Mom has already received a DEBP from her [employer].  Hand out given on what to expect with feedings the first 4 days of life reviewing normal newborn stomach size, normal course of lactation, supply and demand, adequate intake and out put and routine newborn feeding patterns.  Lactation [employer] and [facility]LL hand out given and discussed contact numbers, informative web sites and support groups.  Lactation name and number given and encouraged to call with any questions, concerns or assistance.    Maternal Data Formula Feeding for Exclusion: No Has patient been taught Hand Expression?: Yes Does the patient have breastfeeding experience prior to this delivery?: Yes  Feeding Feeding Type: Breast Fed  LATCH Score Latch: Repeated attempts needed to sustain latch, nipple held in mouth throughout feeding, stimulation needed to elicit sucking reflex.  Audible Swallowing: A few with stimulation  Type of Nipple: Everted at rest and after stimulation (small nipples)  Comfort (Breast/Nipple): Soft / non-tender  Hold (Positioning): Assistance needed to correctly position infant at breast and maintain latch.  LATCH Score: 7  Interventions Interventions: Breast feeding basics reviewed;Assisted with latch;Skin to skin;Breast massage;Hand express;Breast compression;Adjust position;Support pillows;Position options;Coconut oil  Lactation Tools Discussed/Used Tools: Coconut oil WIC Program: No Herbalist)   Consult Status Consult Status: Follow-up Follow-up type: Call as needed    Louis Meckel 07/23/2020, 6:30 PM

## 2020-07-23 NOTE — Addendum Note (Signed)
Addendum  created 07/23/20 1508 by Fernandez Kenley, Cleda Mccreedy, MD   Order list changed, Order sets accessed, Pharmacy for encounter modified

## 2020-07-23 NOTE — Progress Notes (Signed)
Pt admitted for scheduled cesarean section with B.Beasley, MD

## 2020-07-23 NOTE — Transfer of Care (Signed)
Immediate Anesthesia Transfer of Care Note  Patient: Carmen Gibbs  Procedure(s) Performed: REPEAT CESAREAN SECTION WITH BILATERAL TUBAL LIGATION (Bilateral )  Patient Location: PACU  Anesthesia Type:Spinal  Level of Consciousness: awake, alert  and oriented  Airway & Oxygen Therapy: Patient Spontanous Breathing  Post-op Assessment: Report given to RN and Post -op Vital signs reviewed and stable  Post vital signs: Reviewed and stable  Last Vitals:  Vitals Value Taken Time  BP 106/76 07/23/20 1415  Temp 36.4 C 07/23/20 1415  Pulse    Resp 16 07/23/20 1415  SpO2      Last Pain:  Vitals:   07/23/20 1415  TempSrc: Oral  PainSc:          Complications: No complications documented.

## 2020-07-24 ENCOUNTER — Encounter: Payer: Self-pay | Admitting: Obstetrics and Gynecology

## 2020-07-24 LAB — CBC
HCT: 33.7 % — ABNORMAL LOW (ref 36.0–46.0)
Hemoglobin: 11.2 g/dL — ABNORMAL LOW (ref 12.0–15.0)
MCH: 30.7 pg (ref 26.0–34.0)
MCHC: 33.2 g/dL (ref 30.0–36.0)
MCV: 92.3 fL (ref 80.0–100.0)
Platelets: 180 10*3/uL (ref 150–400)
RBC: 3.65 MIL/uL — ABNORMAL LOW (ref 3.87–5.11)
RDW: 14.4 % (ref 11.5–15.5)
WBC: 10.8 10*3/uL — ABNORMAL HIGH (ref 4.0–10.5)
nRBC: 0 % (ref 0.0–0.2)

## 2020-07-24 LAB — RPR: RPR Ser Ql: NONREACTIVE

## 2020-07-24 MED ORDER — IBUPROFEN 800 MG PO TABS
800.0000 mg | ORAL_TABLET | Freq: Four times a day (QID) | ORAL | Status: DC
  Administered 2020-07-24 – 2020-07-25 (×2): 800 mg via ORAL
  Filled 2020-07-24 (×2): qty 1

## 2020-07-24 MED ORDER — ACETAMINOPHEN 500 MG PO TABS
1000.0000 mg | ORAL_TABLET | Freq: Four times a day (QID) | ORAL | Status: DC
  Administered 2020-07-24 – 2020-07-25 (×4): 1000 mg via ORAL
  Filled 2020-07-24 (×4): qty 2

## 2020-07-24 MED ORDER — KETOROLAC TROMETHAMINE 30 MG/ML IJ SOLN
30.0000 mg | Freq: Four times a day (QID) | INTRAMUSCULAR | Status: AC
  Administered 2020-07-24 (×2): 30 mg via INTRAVENOUS
  Filled 2020-07-24 (×3): qty 1

## 2020-07-24 NOTE — Progress Notes (Signed)
Post OP Day 1  Subjective: no complaints, up ad lib, voiding and tolerating PO  Doing well, no concerns. Ambulating without difficulty, pain managed with PO meds, tolerating regular diet, and voiding without difficulty.   No fever/chills, chest pain, shortness of breath, nausea/vomiting, or leg pain. No nipple or breast pain. No headache, visual changes, or RUQ/epigastric pain.  Objective: BP (!) 122/59 (BP Location: Right Arm)   Pulse 67   Temp 97.7 F (36.5 C) (Oral)   Resp 18   Ht 5\' 11"  (1.803 m)   Wt 102 kg   LMP 10/19/2019 (Within Days)   SpO2 99%   Breastfeeding Unknown   BMI 31.36 kg/m    Physical Exam:  General: alert, cooperative and no distress Breasts: soft/nontender CV: RRR Pulm: nl effort, CTABL Abdomen: soft, non-tender, active bowel sounds Uterine Fundus: firm Incision: no significant drainage  Perineum: minimal edema, intact Lochia: appropriate DVT Evaluation: No evidence of DVT seen on physical exam.  Recent Labs    07/23/20 1056 07/24/20 0352  HGB 12.7 11.2*  HCT 36.8 33.7*  WBC 11.0* 10.8*  PLT 245 180    Assessment/Plan: 35 y.o. G2P2002 postpartum day # 1  -Continue routine postpartum care -Lactation consult PRN for breastfeeding -Discussed contraceptive options including implant, IUDs hormonal and non-hormonal, injection, pills/ring/patch, condoms, and NFP.  -Acute blood loss anemia - hemodynamically stable and asymptomatic; start PO ferrous sulfate BID with stool softeners  -Immunization status: all immunizations up to date  Disposition: Continue inpatient postpartum care   LOS: 1 day   34, CNM 07/24/2020, 8:49 AM   ----- 09/21/2020  Certified Nurse Midwife [facility]

## 2020-07-24 NOTE — Lactation Note (Signed)
This note was copied from a baby's chart. Lactation Consultation Note  Patient Name: Carmen Gibbs [id number] Date: 07/24/2020 Reason for consult: Follow-up assessment;Term;Other (Comment) (c-section) Age:33 hours  Lactation called to bedside to assist due to sore nipples. Breast shells provided due to pain associated with touch of clothing. Shell education and cleaning information provided. Bilateral nipple damage evident along with larger L breast than R. Mom unsure of production on R breast, LC was able to demonstrate hand expression. Discussed importance of keeping baby deep/tight onto the breast tissue for feeding, not allowing to slip to tip of nipple. Mom feels this is why she is so sore; LC agrees. Baby resting comfortably in bassinet, <2hr since last feeding, non-cuing. LC discussed feeding assistance with next feeding, mom agreed to call out.  Maternal Data Formula Feeding for Exclusion: No Has patient been taught Hand Expression?: Yes Does the patient have breastfeeding experience prior to this delivery?: Yes  Feeding Feeding Type: Breast Fed  LATCH Score                   Interventions Interventions: Breast feeding basics reviewed;Shells;Hand express  Lactation Tools Discussed/Used Tools: Shells (sore nipples) Shell Type: Sore   Consult Status Consult Status: PRN Date: 07/24/20 Follow-up type: Call as needed    Danford Bad 07/24/2020, 10:38 AM

## 2020-07-24 NOTE — Lactation Note (Signed)
This note was copied from a baby's chart. Lactation Consultation Note  Patient Name: Carmen Gibbs [id number] Date: 07/24/2020 Reason for consult: Follow-up assessment;Term;Other (Comment) (c-section) Age:33 hours  Lactation was in room at the end of feeding. LC noted that baby was having to turn to get to breast in football hold and suggested re-positioning. Mom unable to determine if difference in position felt better. Baby had wide open mouth and flanged lips, and a few swallows were noted even though it was towards the end of the feeding. Mom felt he had done really well on the L breast, and her nipples did not appear to have new or worse damage. LC provided education on position/alignment, turning baby to face the breast, ear/shoulder/hip in straight line, and sandwiching of tissue to ensure deep latch. Praised mom for using additional support behind hand to keep baby deep at the breast, and importance of this. Mom did pull baby off breast instead of breaking the seal; discussed/demonstrated how to do this to prevent further nipple damage. Assisted with hand expression on both nipples to prevent bacterial growth and promote healing, encouraged to let air dry before placing coconut oil and/or shells on.   Maternal Data Formula Feeding for Exclusion: No Has patient been taught Hand Expression?: Yes Does the patient have breastfeeding experience prior to this delivery?: Yes  Feeding Feeding Type: Breast Fed  LATCH Score Latch: Grasps breast easily, tongue down, lips flanged, rhythmical sucking. (after reposition)  Audible Swallowing: A few with stimulation (baby was at end of feed)  Type of Nipple: Everted at rest and after stimulation  Comfort (Breast/Nipple): Filling, red/small blisters or bruises, mild/mod discomfort  Hold (Positioning): No assistance needed to correctly position infant at breast.  LATCH Score: 8  Interventions Interventions: Breast feeding basics reviewed;Adjust  position;Support pillows  Lactation Tools Discussed/Used Tools: Shells (sore nipples) Shell Type: Sore   Consult Status Consult Status: PRN Date: 07/24/20 Follow-up type: Call as needed    Danford Bad 07/24/2020, 1:30 PM

## 2020-07-25 MED ORDER — SENNOSIDES-DOCUSATE SODIUM 8.6-50 MG PO TABS: 2.0000 | ORAL_TABLET | ORAL | 0 refills | Status: AC

## 2020-07-25 MED ORDER — ACETAMINOPHEN 500 MG PO TABS
1000.0000 mg | ORAL_TABLET | Freq: Four times a day (QID) | ORAL | 0 refills | Status: AC
Start: 2020-07-25 — End: ?

## 2020-07-25 MED ORDER — OXYCODONE HCL 5 MG PO TABS: 5.0000 mg | ORAL_TABLET | Freq: Four times a day (QID) | ORAL | 0 refills | Status: AC | PRN

## 2020-07-25 MED ORDER — COCONUT OIL OIL: 1.0000 "application " | TOPICAL_OIL | 0 refills | Status: AC | PRN

## 2020-07-25 MED ORDER — SIMETHICONE 80 MG PO CHEW: 80.0000 mg | CHEWABLE_TABLET | Freq: Three times a day (TID) | ORAL | 0 refills | Status: AC

## 2020-07-25 MED ORDER — FERROUS SULFATE 325 (65 FE) MG PO TABS: 325.0000 mg | ORAL_TABLET | Freq: Two times a day (BID) | ORAL | 3 refills | Status: AC

## 2020-07-25 NOTE — Progress Notes (Signed)
Post OP Day 2  Subjective: no complaints, up ad lib, voiding and tolerating PO  Doing well, no concerns. Ambulating without difficulty, pain managed with PO meds, tolerating regular diet, and voiding without difficulty. Remains asymptomatic for COVID.   No fever/chills, chest pain, shortness of breath, nausea/vomiting, or leg pain. No nipple or breast pain. No headache, visual changes, or RUQ/epigastric pain.  Objective: BP 130/75 (BP Location: Right Arm)   Pulse 89   Temp 98.1 F (36.7 C) (Oral)   Resp 20   Ht 5\' 11"  (1.803 m)   Wt 102 kg   LMP 10/19/2019 (Within Days)   SpO2 100%   Breastfeeding Unknown   BMI 31.36 kg/m    Physical Exam:  General: alert, cooperative and no distress Breasts: soft/nontender CV: RRR Pulm: nl effort, CTABL Abdomen: soft, non-tender, active bowel sounds Uterine Fundus: firm Incision: no significant drainage, no dehiscence. Honeycomb dressing applied.  Lochia: appropriate DVT Evaluation: No evidence of DVT seen on physical exam.  Recent Labs    07/23/20 1056 07/24/20 0352  HGB 12.7 11.2*  HCT 36.8 33.7*  WBC 11.0* 10.8*  PLT 245 180    Assessment/Plan: 34 y.o. G2P2002 postpartum day # 2  -Continue routine postpartum care -Lactation consult PRN for breastfeeding - BTL performed with surgery.  - GDMA1- may discontinue metformin, continue to check CBG at home. Will need PP glucose challenge -Immunization status: all immunizations up to date  Disposition: DC home today if desired   LOS: 2 days   09/21/20, CNM 07/25/2020, 10:31 AM

## 2020-07-25 NOTE — Progress Notes (Signed)
Pt discharged with infant.  Discharge instructions, prescriptions and follow up appointment given to and reviewed with pt. Pt verbalized understanding. Escorted out by staff. 

## 2020-07-25 NOTE — Discharge Instructions (Signed)
Postpartum Care After Cesarean Delivery This sheet gives you information about how to care for yourself from the time you deliver your baby to up to 6-12 weeks after delivery (postpartum period). Your health care provider may also give you more specific instructions. If you have problems or questions, contact your health care provider. Follow these instructions at home: Medicines  Take over-the-counter and prescription medicines only as told by your health care provider.  If you were prescribed an antibiotic medicine, take it as told by your health care provider. Do not stop taking the antibiotic even if you start to feel better.  Ask your health care provider if the medicine prescribed to you: ? Requires you to avoid driving or using heavy machinery. ? Can cause constipation. You may need to take actions to prevent or treat constipation, such as:  Drink enough fluid to keep your urine pale yellow.  Take over-the-counter or prescription medicines.  Eat foods that are high in fiber, such as beans, whole grains, and fresh fruits and vegetables.  Limit foods that are high in fat and processed sugars, such as fried or sweet foods. Activity  Gradually return to your normal activities as told by your health care provider.  Avoid activities that take a lot of effort and energy (are strenuous) until approved by your health care provider. Walking at a slow to moderate pace is usually safe. Ask your health care provider what activities are safe for you. ? Do not lift anything that is heavier than your baby or 10 lb (4.5 kg) as told by your health care provider. ? Do not vacuum, climb stairs, or drive a car for as long as told by your health care provider.  If possible, have someone help you at home until you are able to do your usual activities yourself.  Rest as much as possible. Try to rest or take naps while your baby is sleeping. Vaginal bleeding  It is normal to have vaginal bleeding  (lochia) after delivery. Wear a sanitary pad to absorb vaginal bleeding and discharge. ? During the first week after delivery, the amount and appearance of lochia is often similar to a menstrual period. ? Over the next few weeks, it will gradually decrease to a dry, yellow-brown discharge. ? For most women, lochia stops completely by 4-6 weeks after delivery. Vaginal bleeding can vary from woman to woman.  Change your sanitary pads frequently. Watch for any changes in your flow, such as: ? A sudden increase in volume. ? A change in color. ? Large blood clots.  If you pass a blood clot, save it and call your health care provider to discuss. Do not flush blood clots down the toilet before you get instructions from your health care provider.  Do not use tampons or douches until your health care provider says this is safe.  If you are not breastfeeding, your period should return 6-8 weeks after delivery. If you are breastfeeding, your period may return anytime between 8 weeks after delivery and the time that you stop breastfeeding. Perineal care  If your C-section (Cesarean section) was unplanned, and you were allowed to labor and push before delivery, you may have pain, swelling, and discomfort of the tissue between your vaginal opening and your anus (perineum). You may also have an incision in the tissue (episiotomy) or the tissue may have torn during delivery. Follow these instructions as told by your health care provider: ? Keep your perineum clean and dry as told by your   health care provider. Use medicated pads and pain-relieving sprays and creams as directed. ? If you have an episiotomy or vaginal tear, check the area every day for signs of infection. Check for:  Redness, swelling, or pain.  Fluid or blood.  Warmth.  Pus or a bad smell. ? You may be given a squirt bottle to use instead of wiping to clean the perineum area after you go to the bathroom. As you start healing, you may use  the squirt bottle before wiping yourself. Make sure to wipe gently. ? To relieve pain caused by an episiotomy, vaginal tear, or hemorrhoids, try taking a warm sitz bath 2-3 times a day. A sitz bath is a warm water bath that is taken while you are sitting down. The water should only come up to your hips and should cover your buttocks.   Breast care  Within the first few days after delivery, your breasts may feel heavy, full, and uncomfortable (breast engorgement). You may also have milk leaking from your breasts. Your health care provider can suggest ways to help relieve breast discomfort. Breast engorgement should go away within a few days.  If you are breastfeeding: ? Wear a bra that supports your breasts and fits you well. ? Keep your nipples clean and dry. Apply creams and ointments as told by your health care provider. ? You may need to use breast pads to absorb milk leakage. ? You may have uterine contractions every time you breastfeed for several weeks after delivery. Uterine contractions help your uterus return to its normal size. ? If you have any problems with breastfeeding, work with your health care provider or a lactation consultant.  If you are not breastfeeding: ? Avoid touching your breasts as this can make your breasts produce more milk. ? Wear a well-fitting bra and use cold packs to help with swelling. ? Do not squeeze out (express) milk. This causes you to make more milk. Intimacy and sexuality  Ask your health care provider when you can engage in sexual activity. This may depend on your: ? Risk of infection. ? Healing rate. ? Comfort and desire to engage in sexual activity.  You are able to get pregnant after delivery, even if you have not had your period. If desired, talk with your health care provider about methods of family planning or birth control (contraception). Lifestyle  Do not use any products that contain nicotine or tobacco, such as cigarettes, e-cigarettes,  and chewing tobacco. If you need help quitting, ask your health care provider.  Do not drink alcohol, especially if you are breastfeeding. Eating and drinking  Drink enough fluid to keep your urine pale yellow.  Eat high-fiber foods every day. These may help prevent or relieve constipation. High-fiber foods include: ? Whole grain cereals and breads. ? Brown rice. ? Beans. ? Fresh fruits and vegetables.  Take your prenatal vitamins until your postpartum checkup or until your health care provider tells you it is okay to stop.   General instructions  Keep all follow-up visits for you and your baby as told by your health care provider. Most women visit their health care provider for a postpartum checkup within the first 3-6 weeks after delivery. Contact a health care provider if you:  Feel unable to cope with the changes that a new baby brings to your life, and these feelings do not go away.  Feel unusually sad or worried.  Have breasts that are painful, hard, or turn red.  Have a   fever.  Have trouble holding urine or keeping urine from leaking.  Have little or no interest in activities you used to enjoy.  Have not breastfed at all and you have not had a menstrual period for 12 weeks after delivery.  Have stopped breastfeeding and you have not had a menstrual period for 12 weeks after you stopped breastfeeding.  Have questions about caring for yourself or your baby.  Pass a blood clot from your vagina. Get help right away if you:  Have chest pain.  Have difficulty breathing.  Have sudden, severe leg pain.  Have severe pain or cramping in your abdomen.  Bleed from your vagina so much that you fill more than one sanitary pad in one hour. Bleeding should not be heavier than your heaviest period.  Develop a severe headache.  Faint.  Have blurred vision or spots in your vision.  Have a bad-smelling vaginal discharge.  Have thoughts about hurting yourself or your  baby. If you ever feel like you may hurt yourself or others, or have thoughts about taking your own life, get help right away. You can go to your nearest emergency department or call:  Your local emergency services (911 in the [place]  A suicide crisis helpline, such as the National Suicide Prevention Lifeline at 669 641 3805. This is open 24 hours a day. Summary  The period of time from when you deliver your baby to up to 6-12 weeks after delivery is called the postpartum period.  Gradually return to your normal activities as told by your health care provider.  Keep all follow-up visits for you and your baby as told by your health care provider. This information is not intended to replace advice given to you by your health care provider. Make sure you discuss any questions you have with your health care provider. Document Revised: 02/17/2018 Document Reviewed: 02/17/2018 Elsevier Patient Education  2021 [employer] Breastfeeding Tips for a Good Latch Latching is how your baby's mouth attaches to your nipple to breastfeed. It is an important part of breastfeeding. Your baby may have trouble latching for a number of reasons, such as:  Not being in the right position.  Using a bottle or pacifier too early.  Problems within your baby's mouth, tongue, or lips.  The shape of your nipples.  Your baby being born early (prematurely). Small babies often have a weak suck.  Breasts becoming overfilled with milk (engorged breasts).  Express a little milk to help soften the breast. Work with a breastfeeding specialist (Advertising copywriter) to help your baby have a good latch. How does this affect me? A poor latch may cause you to have problems such as:  Cracked nipples.  Sore nipples.  Breasts becoming overfilled with milk  Plugged milk ducts.  Low milk supply.  Breast inflammation.  Breast infection. How does this affect my baby? A poor latch may cause your baby to not be  able to feed well. As a result, he or she may have trouble gaining weight. Follow these instructions at home: How to position your baby  Find a comfortable place to sit or lie down. Your neck and back should be well supported.  If you are seated, place a pillow or rolled-up blanket under your baby. This will bring him or her to the level of your breast.  Make sure that your baby's belly is facing your belly.  Try different positions to find one that works best for you and your baby. How to help your  baby latch  To start, you might find it helpful to gently rub your breast. Move your fingertips in a circle as you massage from your chest wall toward your nipple. This helps milk flow. Keep doing this during feeding if needed.  Position your breast. Hold your breast with four fingers underneath and your thumb above your nipple. Keep your fingers away from your nipple and your baby's mouth. Follow these steps to help your baby latch: 1. Rub your baby's lips gently with your finger or nipple. 2. When your baby's mouth is open wide enough, quickly bring your baby to your breast and place your whole nipple into your baby's mouth. Place as much of the colored area around your nipple (areola)as possible into your baby's mouth. 3. Your baby's tongue should be between his or her lower gum and your breast. 4. You should be able to see more areola above your baby's upper lip than below the lower lip. 5. When your baby starts sucking, you will feel a gentle pull on your nipple. You should not feel any pain. Be patient. It is common for a baby to suck for about 2-3 minutes to start the flow of breast milk. 6. Make sure that your baby's mouth is in the right position around your nipple. Your baby's lips should make a seal on your breast and be turned outward.   General instructions  Look for these signs that your baby has latched on to your nipple: ? The baby is quietly tugging or sucking without causing  you pain. ? You hear the baby swallow after every 3 or 4 sucks. ? You see movement above and in front of the baby's ears while he or she is sucking.  Be aware of these signs that your baby has not latched on to your nipple: ? The baby makes sucking sounds or smacking sounds while feeding. ? You have nipple pain.  If your baby is not latched well, put your little finger between your baby's gums and your nipple. This will break the seal. Then try to help your baby latch again.  If you need help, get help from a breastfeeding specialist. Contact a doctor if:  You have cracking or soreness in your nipples that lasts longer than 1 week.  You have nipple pain.  Your breasts are filled with too much milk (engorgement), and this does not improve after 48-72 hours.  You have a plugged milk duct and a fever.  You follow the tips for a good latch but need more help.  You have a pus-like fluid coming from your breast.  Your baby is not gaining weight.  Your baby loses weight. Summary  Latching is how your baby's mouth attaches to your nipple to breastfeed.  Try different positions for breastfeeding to find one that works best for you and your baby.  A poor latch may cause you to have cracked or sore nipples or other problems.  Work with a breastfeeding specialist (Advertising copywriter) to help your baby have a good latch. This information is not intended to replace advice given to you by your health care provider. Make sure you discuss any questions you have with your health care provider. Document Revised: 12/28/2019 Document Reviewed: 12/28/2019 Elsevier Patient Education  2021 [employer]. Breastfeeding and Cracked or Sore Nipples It is normal to have some tenderness in your nipples when you start to breastfeed your new baby, even if you have breastfed before. Your nipples can also become cracked or  sore. This may happen if your baby or the baby's mouth is not in the right position  when breastfeeding. There are things you can do to help avoid these problems. How does this affect me? Cracked or sore nipples can be painful. If breastfeeding is too painful or your baby has a poor latch, you may have problems completely emptying your breasts during a feeding. This may lead to problems such as:  Your breasts being too filled with milk (engorgement).  Infection.  A decrease in your milk supply How does this affect my baby? If a poor latch is the cause of cracked or sore nipples, your baby may also have problems getting enough breast milk during a feeding. Follow these instructions at home: Breastfeeding tips  Make sure your baby's mouth attaches to your nipple (latches) properly to breastfeed.  Make sure your baby is in the right position when breastfeeding. Try different positions to find one that works.  Have your baby feed from the less sore breast first.  Break the latch between your baby's mouth and your nipple before removing him or her from your breast. To do this: ? Put your little finger in between your nipple and your baby's gums.   If you use a breast pump:  Make sure the part of the pump that goes over your nipple fits properly.  Start by setting the pump to a low setting. Increase the pump strength over time as needed. Too high of a setting may cause damage to your nipple. Breast care To help your breasts and nipples stay healthy:  Avoid the use of soap on your nipples.  Wear a supportive bra. Avoid wearing underwire bras or tight bras.  Air-dry your nipples for 3-4 minutes after each feeding.  Use only cotton bra pads to soak up any breast milk that leaks. Be sure to change the pads if they become soaked with milk.  Put some lanolin on your nipples after breastfeeding. Pure lanolin does not need to be washed off your nipple before you feed your baby again. Pure lanolin is not harmful to your baby.  Rub some breast milk into your nipples: ? Use  your hand to squeeze out a few drops of breast milk. ? Gently massage the milk into your nipples. ? Let your nipples air-dry. Contact a doctor if:  You have nipple pain.  You have soreness or cracking that lasts more than 1 week. Summary  It is normal to have some tenderness in your nipples when you start to breastfeed your new baby. But you should contact your doctor if you have nipple pain.  Your nipples can become cracked or sore if your baby's mouth does not attach to your nipple properly when breastfeeding.  Rub lanolin or breast milk onto your nipples to keep them from getting cracked or sore.  Avoid washing your nipples with soap. This information is not intended to replace advice given to you by your health care provider. Make sure you discuss any questions you have with your health care provider. Document Revised: 12/28/2019 Document Reviewed: 12/28/2019 Elsevier Patient Education  2021 [employer].

## 2020-07-25 NOTE — Discharge Summary (Signed)
Obstetrical Discharge Summary  Patient Name: Carmen Gibbs DOB: 1985-07-17 MRN: [id number]  Date of Admission: 07/23/2020 Date of Delivery: 07/23/20  Delivered by: Virgel Manifold MD Date of Discharge: 07/25/2020  Primary OB: [facility]  HER:[id number] last menstrual period was 10/19/2019 (within days). EDC Estimated Date of Delivery: 07/30/20 Gestational Age at Delivery: [redacted]w[redacted]d   Antepartum complications:  1. PTL; seen in triage 12/1 -->BMX q24hrs given 12/1 & 12/1, understandsprecautions 2. gDMA2;1hr GTT: 213 -Lifestyles referral placed, started BS logs 06/02/20 -[place] for growth/AFI at 36wks: 16.51cm @ 61% -EFW done 07/03/20: (129g) = 64% -Started 10u NPH nightly 07/03/20, c/w BEB  -Insurance cost is too high for insulin, she declined to start -->started on Metformin 500mg  nightly  -Twice weekly NST w/ weekly AFI ;IOL at 39-40 wks 3. Smoker; down to 1/2 PPD per 11/8/221, encouraged to cut down gradually  4. Hx of Depression & Bipolar disorder; asked to be started on meds, no meds prior to pregnancy -On Zoloft 100mg  nightly, started Abilify 2mg  as well -EPDS: 9/28: 17, 10/25: 10 -Working with [facility] perinatal psych for referrals  -Pt declines counseling, consult placed to [place] [facility] 5. 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 Hx of prior C/S x1; pt w/ scoliosis, baby malpositioned, record request sent; Traumatic birth experience with last delivery  7. Latex allergy  8. Scoliosis; needs anesthesia consult; Pt reports difficulty w/ numbing meds "takes more to numb than usual" 9. Fetal pelviectasis; 05/07/20: R renal pelvis 0.61cm, 11/22: R renal pelvis 0.63cm  Admitting Diagnosis: scheduled CS, COVID Positive; GDMA2 Secondary Diagnosis: repeat LTCS, with permanent sterilization  Patient  Active Problem List   Diagnosis Date Noted  . Previous cesarean delivery affecting pregnancy 07/23/2020  . Supervision of high risk pregnancy in third trimester 07/23/2020  . Amniotic fluid leaking 07/18/2020  . Tobacco use in pregnancy, antepartum 07/13/2020  . Gestational diabetes mellitus (GDM) controlled on oral hypoglycemic drug 07/13/2020  . Back pain affecting pregnancy in third trimester 06/14/2020  . Supervision of other high risk pregnancies, third trimester 01/06/2020  . Varicose veins of left lower extremity with inflammation 09/12/2019  . Chronic gastritis without bleeding 04/20/2019  . S/P cesarean section 05/21/2018  . Bipolar 1 disorder, depressed, severe (HCC) 01/03/2016  . S/P laparoscopic cholecystectomy 12/31/2014  . BMI 28.0-28.9,adult 08/01/2012  . Scoliosis 08/01/2012    Augmentation: N/A Complications: None Intrapartum complications/course: see Op notes Date of Delivery: 07/23/20 Delivered By: 08/03/2012 MD Delivery Type: repeat cesarean section, low transverse incision Anesthesia: spinal Placenta: manual Laceration: none Episiotomy: none  Newborn Data: Live born female  Birth Weight: 7 lb 13.2 oz (3550 g) APGAR: 10, 10  Newborn Delivery   Birth date/time: 07/23/2020 13:05:00 Delivery type: C-Section, Vacuum Assisted Trial of labor: No C-section categorization: Repeat     Postpartum Procedures: tubal done with surgery.   Edinburgh3/10/22 Postnatal Depression Scale Screening Tool 07/24/2020 05/22/2018  I have been able to laugh and see the funny side of things. 0 0  I have looked forward with enjoyment to things. 0 0  I have blamed myself unnecessarily when things went wrong. 1 2  I have been anxious or worried for no good reason. 2 3  I have felt scared or panicky for no good reason. 2 1  Things have been getting on top of me. 2 1  I have been so unhappy that I have had difficulty sleeping.  0 2  I have felt sad or miserable. 1 0  I have been  so unhappy that I have been crying. 1 0  The thought of harming myself has occurred to me. 0 0  Edinburgh Postnatal Depression Scale Total 9 9      Post partum course: Cesarean Section:  Patient had an uncomplicated postpartum course.  By time of discharge on POD#2, her pain was controlled on oral pain medications; she had appropriate lochia and was ambulating, voiding without difficulty, tolerating regular diet and passing flatus.   She was deemed stable for discharge to home.    Discharge Physical Exam:  BP 130/75 (BP Location: Right Arm)   Pulse 89   Temp 98.1 F (36.7 C) (Oral)   Resp 20   Ht 5\' 11"  (1.803 m)   Wt 102 kg   LMP 10/19/2019 (Within Days)   SpO2 100%   Breastfeeding Unknown   BMI 31.36 kg/m   General: NAD CV: RRR Pulm: CTABL, nl effort ABD: s/nd/nt, fundus firm and below the umbilicus Lochia: moderate Incision: c/d/i, healing well, no significant drainage, no dehiscence, no significant erythema DVT Evaluation: LE non-ttp, no evidence of DVT on exam.  Hemoglobin  Date Value Ref Range Status  07/24/2020 11.2 (L) 12.0 - 15.0 g/dL Final   HCT  Date Value Ref Range Status  07/24/2020 33.7 (L) 36.0 - 46.0 % Final     Disposition: stable, discharge to home. Baby Feeding: breastmilk Baby Disposition: home with mom  Rh Immune globulin given: not indicated.  Rubella vaccine given: immune Varicella vaccine given: immune Tdap vaccine given in AP or PP setting: 05/21/20 Flu vaccine given in AP or PP setting: 04/10/20  Contraception: BTL  Prenatal Labs:  ABO, Rh: --/--/O NEG (12/01 1909) Antibody: NEG (12/01 1909) Rubella: Immune (06/28 0000) RPR: Nonreactive (12/21 0000)  HBsAg: Negative (06/28 0000)  HIV: Non-reactive (12/21 0000)  11-09-1997-- (12/21 0000) 1 hr Glucola  213  Genetic screening normal Anatomy 11-09-1997 normal   Plan:  Carmen Gibbs was discharged to home in good condition. Follow-up appointment with delivering provider in 2  weeks.  Discharge Medications: Allergies as of 07/25/2020      Reactions   Ciprofloxacin Hives   IV Cipro gives her blisters   Nicotine Other (See Comments)   Erythematic rash caused by Nicotine Patch   Latex Swelling, Rash      Medication List    STOP taking these medications   metFORMIN 500 MG tablet Commonly known as: GLUCOPHAGE     TAKE these medications   acetaminophen 500 MG tablet Commonly known as: TYLENOL Take 2 tablets (1,000 mg total) by mouth every 6 (six) hours.   ARIPiprazole 2 MG tablet Commonly known as: ABILIFY Take 2 mg by mouth at bedtime.   coconut oil Oil Apply 1 application topically as needed.   ferrous sulfate 325 (65 FE) MG tablet Take 1 tablet (325 mg total) by mouth 2 (two) times daily with a meal.   hydrOXYzine 50 MG tablet Commonly known as: ATARAX/VISTARIL Take 50 mg by mouth at bedtime.   ibuprofen 600 MG tablet Commonly known as: ADVIL Take 1 tablet (600 mg total) by mouth every 6 (six) hours.   loratadine 10 MG tablet Commonly known as: CLARITIN Take 10 mg by mouth at bedtime.   Magnesium 200 MG Tabs Take 200 mg by mouth at bedtime.   omeprazole 40 MG capsule Commonly known as: PriLOSEC Take 1 capsule (40 mg total) by mouth  daily.   oxyCODONE 5 MG immediate release tablet Commonly known as: Oxy IR/ROXICODONE Take 1 tablet (5 mg total) by mouth every 6 (six) hours as needed for up to 7 days for moderate pain.   prenatal multivitamin Tabs tablet Take 1 tablet by mouth at bedtime. DRA 200 mg   senna-docusate 8.6-50 MG tablet Commonly known as: Senokot-S Take 2 tablets by mouth daily.   sertraline 100 MG tablet Commonly known as: ZOLOFT Take 100 mg by mouth at bedtime.   simethicone 80 MG chewable tablet Commonly known as: MYLICON Chew 1 tablet (80 mg total) by mouth 3 (three) times daily after meals.        Follow-up Information    Christeen Douglas, MD In 2 weeks.   Specialty: Obstetrics and Gynecology Why: For  postop check, can be video visit Contact information: [address] [place] [address] [phone]               Signed:  Randa Ngo, CNM 07/25/2020  10:34 AM

## 2020-07-26 LAB — SURGICAL PATHOLOGY

## 2022-05-12 ENCOUNTER — Encounter (INDEPENDENT_AMBULATORY_CARE_PROVIDER_SITE_OTHER): Payer: Self-pay
